# Patient Record
Sex: Female | Born: 1995 | Race: Black or African American | Hispanic: No | Marital: Single | Smoking: Former smoker
Health system: Southern US, Community
[De-identification: ages and names within clinical notes are randomized; demographics above are authoritative.]

## PROBLEM LIST (undated history)

## (undated) DIAGNOSIS — Z789 Other specified health status: Secondary | ICD-10-CM

## (undated) HISTORY — PX: DILATION AND CURETTAGE OF UTERUS: SHX78

## (undated) HISTORY — DX: Other specified health status: Z78.9

---

## 2017-10-22 ENCOUNTER — Encounter (HOSPITAL_COMMUNITY): Payer: Self-pay | Admitting: Emergency Medicine

## 2017-10-22 ENCOUNTER — Emergency Department (HOSPITAL_COMMUNITY)
Admission: EM | Admit: 2017-10-22 | Discharge: 2017-10-22 | Disposition: A | Discharge status: Home / Self Care | Payer: Self-pay | Attending: Emergency Medicine | Admitting: Emergency Medicine

## 2017-10-22 ENCOUNTER — Other Ambulatory Visit: Payer: Self-pay

## 2017-10-22 DIAGNOSIS — F17200 Nicotine dependence, unspecified, uncomplicated: Secondary | ICD-10-CM | POA: Insufficient documentation

## 2017-10-22 DIAGNOSIS — Z3201 Encounter for pregnancy test, result positive: Secondary | ICD-10-CM | POA: Insufficient documentation

## 2017-10-22 DIAGNOSIS — F17228 Nicotine dependence, chewing tobacco, with other nicotine-induced disorders: Secondary | ICD-10-CM | POA: Insufficient documentation

## 2017-10-22 DIAGNOSIS — A599 Trichomoniasis, unspecified: Secondary | ICD-10-CM | POA: Insufficient documentation

## 2017-10-22 LAB — WET PREP, GENITAL
CLUE CELLS WET PREP: NONE SEEN
Sperm: NONE SEEN
Yeast Wet Prep HPF POC: NONE SEEN

## 2017-10-22 LAB — I-STAT BETA HCG BLOOD, ED (MC, WL, AP ONLY)

## 2017-10-22 MED ORDER — FOLIC ACID 800 MCG PO TABS: 400.0000 ug | ORAL_TABLET | Freq: Every day | ORAL | 0 refills | Status: DC

## 2017-10-22 MED ORDER — METRONIDAZOLE 500 MG PO TABS
2000.0000 mg | ORAL_TABLET | Freq: Once | ORAL | Status: AC
  Administered 2017-10-22: 2000 mg via ORAL
  Filled 2017-10-22: qty 4

## 2017-10-22 NOTE — Discharge Instructions (Signed)
Your pregnancy test was positive today.   You have been treated for trichomonas in the emergency department today.  This is a sexually transmitted infection.  Please inform all of your sexual partners and let them know that they will need to be treated.  I have listed the information below to the health department where they can be treated.   Your chlamydia, gonorrhea, HIV and syphilis testing take a few days to return. Please follow up at the health department for treatment if any of these results are positive.   I have listed the information to [facility].  Please call and schedule an appointment for pregnancy care.  I have also written you a prescription for a prenatal vitamin.  It is important that you take this daily.  Return to the emergency department for any new or worsening symptoms.

## 2017-10-22 NOTE — ED Triage Notes (Signed)
States needs preg test and needs std c heck , LMP  Oc t 23 G 3 P 1 A 1 L1, spotted  Yesterday  Just a little,  Denies dysuria or vag d/c or any STD s/s

## 2017-10-22 NOTE — ED Provider Notes (Signed)
[facility] EMERGENCY [facility] Provider Note   CSN: [id number] Arrival date & time: 10/22/17  1557     History   Chief Complaint Chief Complaint  Patient presents with  . Exposure to STD    HPI Teresa Todd is a 21 y.o. female.  HPI  Teresa Todd is a 21yo female with no significant past medical history who presents to the emergency department for pregnancy test and STD check.  She states that her last menstrual period was October 23rd.  States that she had minimal spotting yesterday.  Denies fever, chills, fatigue, abdominal pain, dysuria, urinary frequency, vaginal discharge, pelvic pain, nausea/vomiting, diarrhea.  Denies menstrual bleeding today.  Is sexually active with one female partner.  Reports that she moved from [place] and does not have an OB/GYN set up here.  History reviewed. No pertinent past medical history.  There are no active problems to display for this patient.   History reviewed. No pertinent surgical history.  OB History    No data available       Home Medications    Prior to Admission medications   Not on File    Family History No family history on file.  Social History Social History   Tobacco Use  . Smoking status: Current Every Day Smoker  . Smokeless tobacco: Current User  Substance Use Topics  . Alcohol use: Not on file  . Drug use: Not on file     Allergies   Patient has no known allergies.   Review of Systems Review of Systems  Constitutional: Negative for chills, fatigue and fever.  HENT: Negative for congestion.   Eyes: Negative for visual disturbance.  Respiratory: Negative for shortness of breath.   Cardiovascular: Negative for chest pain.  Gastrointestinal: Negative for abdominal pain, diarrhea, nausea and vomiting.  Genitourinary: Negative for difficulty urinating, dysuria, flank pain, frequency, pelvic pain, vaginal bleeding and vaginal discharge.  Musculoskeletal: Negative for gait  problem.  Skin: Negative for rash.  Neurological: Negative for dizziness, light-headedness and headaches.  Psychiatric/Behavioral: Negative for agitation.     Physical Exam Updated Vital Signs There were no vitals taken for this visit.  Physical Exam  Constitutional: She is oriented to person, place, and time. She appears well-developed and well-nourished. No distress.  HENT:  Head: Normocephalic and atraumatic.  Mouth/Throat: Oropharynx is clear and moist. No oropharyngeal exudate.  Eyes: Conjunctivae are normal. Pupils are equal, round, and reactive to light. Right eye exhibits no discharge. Left eye exhibits no discharge.  Neck: Normal range of motion. Neck supple.  Cardiovascular: Normal rate, regular rhythm and intact distal pulses. Exam reveals no friction rub.  No murmur heard. Pulmonary/Chest: Effort normal. No respiratory distress.  Abdominal: Soft. Bowel sounds are normal. There is no tenderness. There is no guarding.  Musculoskeletal: Normal range of motion.  Neurological: She is alert and oriented to person, place, and time. Coordination normal.  Skin: Skin is warm and dry. Capillary refill takes less than 2 seconds. She is not diaphoretic.  Psychiatric: She has a normal mood and affect. Her behavior is normal.  Nursing note and vitals reviewed.   ED Treatments / Results  Labs (all labs ordered are listed, but only abnormal results are displayed) Labs Reviewed  I-STAT BETA HCG BLOOD, ED ([facility], [facility], [facility] ONLY) - Abnormal; Notable for the following components:      Result Value   I-stat hCG, quantitative >2,000.0 (*)    All other components within normal limits  RPR  HIV ANTIBODY (ROUTINE TESTING)    EKG  EKG Interpretation None       Radiology No results found.  Procedures Procedures (including critical care time)  Medications Ordered in ED Medications - No data to display   Initial Impression / Assessment and Plan / ED Course  I have reviewed the  triage vital signs and the nursing notes.  Pertinent labs & imaging results that were available during my care of the patient were reviewed by me and considered in my medical decision making (see chart for details).    Pregnancy test is positive and patient informed. Patient treated for trichomonas with Flagyl in the emergency department today.  Counseled her to inform all sexual partners and let them know that they will need to be treated as well.  Have counseled her to refrain from sexual intercourse until all sexual partners have been treated.  She has chlamydia/gonorrhea, HIV and syphilis testing which is pending, counseled her that she will need to be treated at the health department if these results return positive.  Patient agrees and voiced understanding to this plan.  Her vital signs are stable and she is in no acute distress.  She has no complaints prior to discharge. Have given patient information to establish care with women's health for pregnancy care and also written her a prescription for folic acid.   Final Clinical Impressions(s) / ED Diagnoses   Final diagnoses:  Positive pregnancy test  Trichomonosis     Teresa Todd, Teresa Maret Todd, Teresa Todd 10/22/17 1952    Teresa Todd, Teresa Duo, Teresa Todd 10/23/17 1346

## 2017-10-23 LAB — HIV ANTIBODY (ROUTINE TESTING W REFLEX): HIV SCREEN 4TH GENERATION: NONREACTIVE

## 2017-10-23 LAB — RPR: RPR Ser Ql: NONREACTIVE

## 2017-10-23 LAB — GC/CHLAMYDIA PROBE AMP (~~LOC~~) NOT AT ARMC
CHLAMYDIA, DNA PROBE: NEGATIVE
Neisseria Gonorrhea: NEGATIVE

## 2017-12-02 ENCOUNTER — Other Ambulatory Visit: Payer: Medicaid Other

## 2017-12-02 DIAGNOSIS — Z3480 Encounter for supervision of other normal pregnancy, unspecified trimester: Secondary | ICD-10-CM

## 2017-12-03 ENCOUNTER — Telehealth: Payer: Self-pay | Admitting: General Practice

## 2017-12-03 ENCOUNTER — Other Ambulatory Visit: Payer: Self-pay | Admitting: Advanced Practice Midwife

## 2017-12-03 DIAGNOSIS — Z3480 Encounter for supervision of other normal pregnancy, unspecified trimester: Secondary | ICD-10-CM | POA: Insufficient documentation

## 2017-12-03 LAB — OBSTETRIC PANEL, INCLUDING HIV
ANTIBODY SCREEN: NEGATIVE
BASOS ABS: 0 10*3/uL (ref 0.0–0.2)
Basos: 0 %
EOS (ABSOLUTE): 0.1 10*3/uL (ref 0.0–0.4)
Eos: 1 %
HIV Screen 4th Generation wRfx: NONREACTIVE
Hematocrit: 37.8 % (ref 34.0–46.6)
Hemoglobin: 12.6 g/dL (ref 11.1–15.9)
Hepatitis B Surface Ag: NEGATIVE
IMMATURE GRANS (ABS): 0 10*3/uL (ref 0.0–0.1)
IMMATURE GRANULOCYTES: 0 %
LYMPHS: 22 %
Lymphocytes Absolute: 1.9 10*3/uL (ref 0.7–3.1)
MCH: 30.1 pg (ref 26.6–33.0)
MCHC: 33.3 g/dL (ref 31.5–35.7)
MCV: 90 fL (ref 79–97)
MONOCYTES: 7 %
MONOS ABS: 0.6 10*3/uL (ref 0.1–0.9)
NEUTROS PCT: 70 %
Neutrophils Absolute: 5.8 10*3/uL (ref 1.4–7.0)
PLATELETS: 346 10*3/uL (ref 150–379)
RBC: 4.19 x10E6/uL (ref 3.77–5.28)
RDW: 13.8 % (ref 12.3–15.4)
RPR Ser Ql: NONREACTIVE
Rh Factor: POSITIVE
Rubella Antibodies, IGG: 3.04 index (ref >0.99)
WBC: 8.4 10*3/uL (ref 3.4–10.8)

## 2017-12-03 NOTE — Telephone Encounter (Signed)
Patient called and left message on nurse line stating she is calling to give [place] so we can have that in our system prior to her new OB appt on Monday. Called patient, no answer- left message on voicemail stating we are returning her phone call and have received her voicemail message. We do not need your ID number in advance, just bring your insurance card to your appt on Monday. You may call us back if you have other questions

## 2017-12-09 ENCOUNTER — Other Ambulatory Visit (HOSPITAL_COMMUNITY)
Admission: RE | Admit: 2017-12-09 | Discharge: 2017-12-09 | Disposition: A | Discharge status: Home / Self Care | Payer: Medicaid Other | Source: Ambulatory Visit | Attending: Advanced Practice Midwife | Admitting: Advanced Practice Midwife

## 2017-12-09 ENCOUNTER — Encounter: Payer: Self-pay | Admitting: Advanced Practice Midwife

## 2017-12-09 ENCOUNTER — Ambulatory Visit (INDEPENDENT_AMBULATORY_CARE_PROVIDER_SITE_OTHER): Payer: Medicaid Other | Admitting: Advanced Practice Midwife

## 2017-12-09 DIAGNOSIS — Z3482 Encounter for supervision of other normal pregnancy, second trimester: Secondary | ICD-10-CM | POA: Diagnosis present

## 2017-12-09 DIAGNOSIS — O09892 Supervision of other high risk pregnancies, second trimester: Secondary | ICD-10-CM | POA: Insufficient documentation

## 2017-12-09 DIAGNOSIS — Z23 Encounter for immunization: Secondary | ICD-10-CM

## 2017-12-09 DIAGNOSIS — Z3480 Encounter for supervision of other normal pregnancy, unspecified trimester: Secondary | ICD-10-CM

## 2017-12-09 DIAGNOSIS — O09899 Supervision of other high risk pregnancies, unspecified trimester: Secondary | ICD-10-CM

## 2017-12-09 LAB — POCT URINALYSIS DIP (DEVICE)
BILIRUBIN URINE: NEGATIVE
GLUCOSE, UA: NEGATIVE mg/dL
Hgb urine dipstick: NEGATIVE
LEUKOCYTES UA: NEGATIVE
NITRITE: NEGATIVE
Protein, ur: NEGATIVE mg/dL
Specific Gravity, Urine: >=1.03 (ref 1.005–1.030)
Urobilinogen, UA: 1 mg/dL (ref 0.0–1.0)
pH: 6 (ref 5.0–8.0)

## 2017-12-09 MED ORDER — PRENATAL VITAMINS 0.8 MG PO TABS: 1.0000 | ORAL_TABLET | Freq: Every day | ORAL | 12 refills | Status: DC

## 2017-12-09 NOTE — Progress Notes (Signed)
  Subjective:    Teresa Todd is being seen today for her first obstetrical visit.  This is not a planned pregnancy. She is at 5447w6d gestation. Her obstetrical history is significant for obesity. Relationship with FOB: "just friends". Patient does intend to breast feed. Pregnancy history fully reviewed.  [place].   Patient reports no complaints.  Review of Systems:   Review of Systems  All other systems reviewed and are negative.   Objective:     BP 122/73   Pulse 79   Ht 5\' 4"  (1.626 m)   Wt 210 lb (95.3 kg)   LMP 08/27/2017 (Exact Date)   BMI 36.05 kg/m  Physical Exam  Nursing note and vitals reviewed. Constitutional: She is oriented to person, place, and time. She appears well-developed and well-nourished. No distress.  HENT:  Head: Normocephalic.  Cardiovascular: Normal rate.  Respiratory: Effort normal. Right breast exhibits no inverted nipple, no mass, no nipple discharge, no skin change and no tenderness. Left breast exhibits no inverted nipple, no mass, no nipple discharge, no skin change and no tenderness. Breasts are symmetrical.  GI: Soft. There is no tenderness. There is no rebound.  Genitourinary: No vaginal discharge found.  Neurological: She is alert and oriented to person, place, and time.  Skin: Skin is warm and dry.  Psychiatric: She has a normal mood and affect.    Maternal Exam:  Introitus: Vagina is negative for discharge.    Fetal Exam Fetal Monitor Review: Mode: ultrasound.   Baseline rate: 140.         Assessment:    Pregnancy: [id number] Patient Active Problem List   Diagnosis Date Noted  . Short interval between pregnancies affecting pregnancy, antepartum 12/09/2017  . Supervision of other normal pregnancy, antepartum 12/03/2017       Plan:     Initial labs drawn. Had pap done in [place], will sign release for results.  Prenatal vitamins. SMA, CF, Hgb  elec, panorama  Problem list reviewed and updated. AFP3 discussed: undecided. Role of ultrasound in pregnancy discussed; fetal survey: requested. Amniocentesis discussed: not indicated. Follow up in 6 weeks. 50% of 60 min visit spent on counseling and coordination of care.     Thressa ShellerHeather Hogan 12/09/2017

## 2017-12-09 NOTE — Patient Instructions (Signed)
AREA PEDIATRIC/FAMILY PRACTICE PHYSICIANS  [facility] [address], [address] [place], [place]  [address] Phone - [phone]   Fax - [phone]  [facility] [address] [place], [place] [address] Phone - [phone]   Fax - [phone]  JACK AMOS [address] [place], [place]  [address] Phone - [phone]   Fax - [phone]  [facility] [address] [place], [place]  [address] Phone - [phone]   Fax - [phone]  [facility] [address] [place], [place]  [address] Phone - [phone]   Fax - [phone]  [facility] [address] [place], [place]  [address] Phone - [phone]   Fax - [phone]  [facility] [address] [place], [place]  [address] Phone - [phone]   Fax - [phone]  [facility] [address] [place], [place]  [address] Phone - [phone]   Fax - [phone]  [facility] [address] [place], [place]  [address] Phone - [phone]   Fax - [phone] [facility] [address] [place], [place]  [address] Phone - [phone]   Fax - [phone]  [facility] [address] [place], [place]  [address] Phone - [phone]   Fax - [phone]  [facility] [address] [place], [place]  [address] Phone - [phone]   Fax - [phone]  [facility] [address] [place], [place]  [address] Phone - [phone]   Fax - [phone]  SHILPA GOSRANI [address] [place], [place]  [address] Phone - [phone]  [facility] [address] [place], [place]  [address] Phone - [phone]   Fax - [phone]  [facility] [address] [place], [place]  [address] Phone - [phone]   Fax - [phone]  [facility] [address] [place], [place]  [address] Phone - [phone]   Fax - [phone]  [facility] [address] [place], [place]  [address] Phone - [phone]   Fax - [phone]   [facility] [address] [place], [place]  [address] Phone - [phone]   Fax - [phone]  [facility] [address] [place], [place]  [address] Phone - [phone]   Fax - [phone]  DAVID RUBIN [address] [place], [place]  [address] Phone - [phone]   Fax - [phone]  [facility] [address] [place], [place]  [address] Phone - [phone]   Fax - [phone]  [facility] [address] [place], [place]  [address] Phone - [phone]   Fax - [phone] [facility] [address] [place], [place]  [address] Phone - [phone]   Fax - [phone]  [facility] [address] [place], [place]  [address] Phone - [phone]   Fax - [phone]   [facility] [address] [place],   [address] Phone - [phone]   Fax - [phone]  [facility] Charlene Flemming MD [address] [place]  [address] Phone [phone]  Fax [phone]  Childbirth Education Options: [facility] Classes:  Childbirth education classes can help you   get ready for a positive parenting experience. You can also meet other expectant parents and get free stuff for your baby. Each class runs for five weeks on the same night and costs $45 for the mother-to-be and her support person. Medicaid covers the cost if you are eligible. Call [phone] to register. [facility] Childbirth Education:  [phone] or 5174845873 or  [email or web address]  Baby & Me Class: Discuss newborn & infant parenting and family adjustment issues with other new mothers in a relaxed environment. Each week brings a new speaker or baby-centered activity. We encourage new mothers to join [place] every Thursday at 11:00am. Babies birth until crawling. No registration or fee. Daddy [employer]: This course offers Dads-to-be the tools and knowledge needed to feel confident on their journey to becoming new fathers. Experienced dads, who have been trained as coaches, teach dads-to-be how to hold, comfort, diaper, swaddle and play with their infant while being able to support the new mom as well. A class for men taught by men. $25/dad Big Brother/Big Sister: Let your children share in the joy of a new brother or sister in this special class designed just for them. Class includes discussion about how families care for babies: swaddling, holding, diapering, safety as well as how they can be helpful in their new role. This class is designed for children ages 26 to 31, but any age is welcome. Please register each child individually. $5/child  Mom Talk: This mom-led group offers support and connection to mothers as they journey through the adjustments and struggles of that sometimes overwhelming first year after the birth of a child. Tuesdays at 10:00am and Thursdays at 6:00pm. Babies welcome. No registration or fee. Breastfeeding Support Group: This group is a mother-to-mother support circle where moms have the opportunity to share their breastfeeding experiences. A Lactation Consultant is present for questions and concerns. Meets each Tuesday at 11:00am. No fee or registration. Breastfeeding Your Baby: Learn what to expect in the first days of breastfeeding your newborn.  This class will help you feel more confident with the skills needed to begin your breastfeeding experience. Many new mothers are concerned about breastfeeding after leaving the hospital. This class  will also address the most common fears and challenges about breastfeeding during the first few weeks, months and beyond. (call for fee) Comfort Techniques and Tour: This 2 hour interactive class will provide you the opportunity to learn & practice hands-on techniques that can help relieve some of the discomfort of labor and encourage your baby to rotate toward the best position for birth. You and your partner will be able to try a variety of labor positions with birth balls and rebozos as well as practice breathing, relaxation, and visualization techniques. A tour of the [facility] is included with this class. $20 per registrant and support person Childbirth Class- Weekend Option: This class is a Weekend version of our Birth & Baby series. It is designed for parents who have a difficult time fitting several weeks of classes into their schedule. It covers the care of your newborn and the basics of labor and childbirth. It also includes a [place] [facility] and lunch. The class is held two consecutive days: beginning on Friday evening from 6:30 - 8:30 p.m. and the next day, Saturday from 9 a.m. - 4 p.m. (call for fee) Doren Custard Class: Interested in a waterbirth?  This informational class will help you discover whether waterbirth is the right fit for you.  Education about waterbirth itself, supplies you would need and how to assemble your support team is what you can expect from this class. Some obstetrical practices require this class in order to pursue a waterbirth. (Not all obstetrical practices offer waterbirth-check with your healthcare provider.) Register only the expectant mom, but you are encouraged to bring your partner to class! Required if planning waterbirth, no fee. Infant/Child CPR: Parents, grandparents, babysitters, and friends learn Cardio-Pulmonary Resuscitation skills for infants and children. You will also learn how to treat both conscious  and unconscious choking in infants and children. This Family & Friends program does not offer certification. Register each participant individually to ensure that enough mannequins are available. (Call for fee) Grandparent Love: Expecting a grandbaby? This class is for you! Learn about the latest infant care and safety recommendations and ways to support your own child as he or she transitions into the parenting role. Taught by Registered Nurses who are childbirth instructors, but most importantly...they are grandmothers too! $10/person. Childbirth Class- Natural Childbirth: This series of 5 weekly classes is for expectant parents who want to learn and practice natural methods of coping with the process of labor and childbirth. Relaxation, breathing, massage, visualization, role of the partner, and helpful positioning are highlighted. Participants learn how to be confident in their body's ability to give birth. This class will empower and help parents make informed decisions about their own care. Includes discussion that will help new parents transition into the immediate postpartum period. [facility] is included. We suggest taking this class between 25-32 weeks, but it's only a recommendation. $75 per registrant and one support person or $30 Medicaid. Childbirth Class- 3 week Series: This option of 3 weekly classes helps you and your labor partner prepare for childbirth. Newborn care, labor & birth, cesarean birth, pain management, and comfort techniques are discussed and a [place] [facility] is included. The class meets at the same time, on the same day of the week for 3 consecutive weeks beginning with the starting date you choose. $60 for registrant and one support person.  Marvelous Multiples: Expecting twins, triplets, or more? This class covers the differences in labor, birth, parenting, and breastfeeding issues that face multiples' parents.  NICU tour is included. Led by a Certified Childbirth Educator who is the mother of twins. No fee. Caring for Baby: This class is for expectant and adoptive parents who want to learn and practice the most up-to-date newborn care for their babies. Focus is on birth through the first six weeks of life. Topics include feeding, bathing, diapering, crying, umbilical cord care, circumcision care and safe sleep. Parents learn to recognize symptoms of illness and when to call the pediatrician. Register only the mom-to-be and your partner or support person can plan to come with you! $10 per registrant and support person Childbirth Class- online option: This online class offers you the freedom to complete a Birth and Baby series in the comfort of your own home. The flexibility of this option allows you to review sections at your own pace, at times convenient to you and your support people. It includes additional video information, animations, quizzes, and extended activities. Get organized with helpful eClass tools, checklists, and trackers. Once you register online for the class, you will receive an email within a few days to accept the invitation and begin the class when the time is right for you. The content will be available to you for 60 days. $  60 for 60 days of online access for you and your support people.  Local Doulas: Natural Baby Doulas [email or web address] Tel: [phone] [email or web address] [facility] [phone] [email or web address] The Labor Ladies  (also do waterbirth tub rental) [phone] [email or web address] [email or web address] [phone] [email or web address] Sacred Rhythms  [phone] [email or web address]/ [place] [facility] [employer] (PADA) [email or web address] http://www.padanc.org/index.htm [facility]   [email or web address] Considering Waterbirth? Guide for [facility] at [facility]  Why consider waterbirth?  . Gentle birth for babies . Less pain medicine used in labor . May allow for passive descent/less pushing . May reduce perineal tears  . More mobility and instinctive maternal position changes . Increased maternal relaxation . Reduced blood pressure in labor  Is waterbirth safe? What are the risks of infection, drowning or other complications?  . Infection: o Very low risk (3.7 % for tub vs 4.8% for bed) o 7 in 8000 waterbirths with documented infection o Poorly cleaned equipment most common cause o Slightly lower group B strep transmission rate  . Drowning o Maternal:  - Very low risk   - Related to seizures or fainting o Newborn:  - Very low risk. No evidence of increased risk of respiratory problems in multiple large studies - Physiological protection from breathing under water - Avoid underwater birth if there are any fetal complications - Once baby's head is out of the water, keep it out.  . Birth complication o Some reports of cord trauma, but risk decreased by bringing baby to surface gradually o No evidence of increased risk of shoulder dystocia. Mothers can usually change positions faster in water than in a bed, possibly aiding the maneuvers to free the shoulder.   You must attend a [facility] class [facility]  3rd Wednesday of every month from 7-9pm  Free  Register by calling [phone] or online at [email or web address]  Bring us the certificate from the class to your prenatal appointment  Meet with a midwife at 36 weeks to see if you can still plan a waterbirth and to sign the consent.   Purchase or rent the following supplies:   Water Birth Pool (Birth Pool in a Box or [place] for instance)  (Tubs start ~$125)  Single-use disposable tub liner designed for your brand of tub  New garden hose labeled  "lead-free", "suitable for drinking water",  Electric drain pump to remove water (We recommend 792 gallon per hour or greater pump.)   Separate garden hose to remove the dirty water  Fish net  Bathing suit top (optional)  Long-handled mirror (optional)  Places to purchase or rent supplies  [email or web address] for tub purchases and supplies  Waterbirthsolutions.com for tub purchases and supplies  The Labor Ladies (www.[email or web address]) $275 for tub rental/set-up & take down/kit   [place] [facility] (http://www.padanc.org/MeetUs.htm) Information regarding doulas (labor support) who provide pool rentals  Our practice has a Birth Pool in a Box tub at the hospital that you may borrow on a first-come-first-served basis. It is your responsibility to to set up, clean and break down the tub. We cannot guarantee the availability of this tub in advance. You are responsible for bringing all accessories listed above. If you do not have all necessary supplies you cannot have a waterbirth.    Things that would prevent you from having a waterbirth:  Premature, <37wks  Previous cesarean birth  Presence of thick meconium-stained fluid  Multiple gestation (Twins, triplets, etc.)  Uncontrolled diabetes or gestational diabetes requiring medication  Hypertension requiring medication   or diagnosis of pre-eclampsia  Heavy vaginal bleeding  Non-reassuring fetal heart rate  Active infection (MRSA, etc.). Group B Strep is NOT a contraindication for  waterbirth.  If your labor has to be induced and induction method requires continuous  monitoring of the baby's heart rate  Other risks/issues identified by your obstetrical provider  Please remember that birth is unpredictable. Under certain unforeseeable circumstances your provider may advise against giving birth in the tub. These decisions will be made on a case-by-case basis and with the safety of you and your baby as our highest  priority.     Childbirth Education Options: [facility] Classes:  Childbirth education classes can help you get ready for a positive parenting experience. You can also meet other expectant parents and get free stuff for your baby. Each class runs for five weeks on the same night and costs $45 for the mother-to-be and her support person. Medicaid covers the cost if you are eligible. Call [phone] to register. [facility] Childbirth Education:  [phone] or [phone] or [email or web address]  Baby & Me Class: Discuss newborn & infant parenting and family adjustment issues with other new mothers in a relaxed environment. Each week brings a new speaker or baby-centered activity. We encourage new mothers to join [place] every Thursday at 11:00am. Babies birth until crawling. No registration or fee. Daddy [employer]: This course offers Dads-to-be the tools and knowledge needed to feel confident on their journey to becoming new fathers. Experienced dads, who have been trained as coaches, teach dads-to-be how to hold, comfort, diaper, swaddle and play with their infant while being able to support the new mom as well. A class for men taught by men. $25/dad Big Brother/Big Sister: Let your children share in the joy of a new brother or sister in this special class designed just for them. Class includes discussion about how families care for babies: swaddling, holding, diapering, safety as well as how they can be helpful in their new role. This class is designed for children ages 30 to 10, but any age is welcome. Please register each child individually. $5/child  Mom Talk: This mom-led group offers support and connection to mothers as they journey through the adjustments and struggles of that sometimes overwhelming first year after the birth of a child. Tuesdays at 10:00am and Thursdays at 6:00pm. Babies welcome. No registration or fee. Breastfeeding Support Group: This group is a  mother-to-mother support circle where moms have the opportunity to share their breastfeeding experiences. A Lactation Consultant is present for questions and concerns. Meets each Tuesday at 11:00am. No fee or registration. Breastfeeding Your Baby: Learn what to expect in the first days of breastfeeding your newborn.  This class will help you feel more confident with the skills needed to begin your breastfeeding experience. Many new mothers are concerned about breastfeeding after leaving the hospital. This class will also address the most common fears and challenges about breastfeeding during the first few weeks, months and beyond. (call for fee) Comfort Techniques and Tour: This 2 hour interactive class will provide you the opportunity to learn & practice hands-on techniques that can help relieve some of the discomfort of labor and encourage your baby to rotate toward the best position for birth. You and your partner will be able to try a variety of labor positions with birth balls and rebozos as well as practice breathing, relaxation, and visualization techniques. A tour of the [facility] is included with this class. $20  per registrant and support person Childbirth Class- Weekend Option: This class is a Weekend version of our Birth & Baby series. It is designed for parents who have a difficult time fitting several weeks of classes into their schedule. It covers the care of your newborn and the basics of labor and childbirth. It also includes a [place] [facility] and lunch. The class is held two consecutive days: beginning on Friday evening from 6:30 - 8:30 p.m. and the next day, Saturday from 9 a.m. - 4 p.m. (call for fee) Doren Custard Class: Interested in a waterbirth?  This informational class will help you discover whether waterbirth is the right fit for you. Education about waterbirth itself, supplies you would need and how to assemble your support team  is what you can expect from this class. Some obstetrical practices require this class in order to pursue a waterbirth. (Not all obstetrical practices offer waterbirth-check with your healthcare provider.) Register only the expectant mom, but you are encouraged to bring your partner to class! Required if planning waterbirth, no fee. Infant/Child CPR: Parents, grandparents, babysitters, and friends learn Cardio-Pulmonary Resuscitation skills for infants and children. You will also learn how to treat both conscious and unconscious choking in infants and children. This Family & Friends program does not offer certification. Register each participant individually to ensure that enough mannequins are available. (Call for fee) Grandparent Love: Expecting a grandbaby? This class is for you! Learn about the latest infant care and safety recommendations and ways to support your own child as he or she transitions into the parenting role. Taught by Registered Nurses who are childbirth instructors, but most importantly...they are grandmothers too! $10/person. Childbirth Class- Natural Childbirth: This series of 5 weekly classes is for expectant parents who want to learn and practice natural methods of coping with the process of labor and childbirth. Relaxation, breathing, massage, visualization, role of the partner, and helpful positioning are highlighted. Participants learn how to be confident in their body's ability to give birth. This class will empower and help parents make informed decisions about their own care. Includes discussion that will help new parents transition into the immediate postpartum period. [facility] is included. We suggest taking this class between 25-32 weeks, but it's only a recommendation. $75 per registrant and one support person or $30 Medicaid. Childbirth Class- 3 week Series: This option of 3 weekly classes helps you and your labor partner prepare for  childbirth. Newborn care, labor & birth, cesarean birth, pain management, and comfort techniques are discussed and a [place] [facility] is included. The class meets at the same time, on the same day of the week for 3 consecutive weeks beginning with the starting date you choose. $60 for registrant and one support person.  Marvelous Multiples: Expecting twins, triplets, or more? This class covers the differences in labor, birth, parenting, and breastfeeding issues that face multiples' parents. NICU tour is included. Led by a Certified Childbirth Educator who is the mother of twins. No fee. Caring for Baby: This class is for expectant and adoptive parents who want to learn and practice the most up-to-date newborn care for their babies. Focus is on birth through the first six weeks of life. Topics include feeding, bathing, diapering, crying, umbilical cord care, circumcision care and safe sleep. Parents learn to recognize symptoms of illness and when to call the pediatrician. Register only the mom-to-be and your partner or support person can plan  to come with you! $10 per registrant and support person Childbirth Class- online option: This online class offers you the freedom to complete a Birth and Baby series in the comfort of your own home. The flexibility of this option allows you to review sections at your own pace, at times convenient to you and your support people. It includes additional video information, animations, quizzes, and extended activities. Get organized with helpful eClass tools, checklists, and trackers. Once you register online for the class, you will receive an email within a few days to accept the invitation and begin the class when the time is right for you. The content will be available to you for 60 days. $60 for 60 days of online access for you and your support people.  Local Doulas: Natural Baby Doulas [email or web address] Tel:  [phone] [email or web address]/ [employer] [phone] [email or web address] .com www.[email or web address].com The Labor [facility] Halim  (also do waterbirth tub rental) [phone] [email or web address] https://www.[email or web address]/ [facility]oula [phone] [email or web address] [email or web address].fi Sacred Rhythms  [phone] https://sacred-rhythms.com/ [employer] (PADA) [email or web address] https://www.frey.org/ [facility] Baby  http://[email or web address]/ Considering Waterbirth? Guide for patients at [facility] [employer] [facility]  Why consider waterbirth?  . Gentle birth for babies . Less pain medicine used in labor . May allow for passive descent/less pushing . May reduce perineal tears  . More mobility and instinctive maternal position changes . Increased maternal relaxation . Reduced blood pressure in labor  Is waterbirth safe? What are the risks of infection, drowning or other complications?  . Infection: o Very low risk (3.7 % for tub vs 4.8% for bed) o 7 in 8000 waterbirths with documented infection o Poorly cleaned equipment most common cause o Slightly lower group B strep transmission rate  . Drowning o Maternal:  - Very low risk   - Related to seizures or fainting o Newborn:  - Very low risk. No evidence of increased risk of respiratory problems in multiple large studies - Physiological protection from breathing under water - Avoid underwater birth if there are any fetal complications - Once baby's head is out of the water, keep it out.  . Birth complication o Some reports of cord trauma, but risk decreased by bringing baby to surface gradually o No evidence of increased risk of shoulder dystocia. Mothers can usually change positions faster in water than in a bed, possibly aiding the maneuvers to free the shoulder.   You must attend a Doren Custard class at [facility]  3rd Wednesday of every month from 7-9pm  [employer] by calling [phone] or online at [email or web address]  Bring [place] the certificate from the class to your prenatal appointment  Meet with a midwife at 36 weeks to see if you can still plan a waterbirth and to sign the consent.   Purchase or rent the following supplies:   Water Birth Pool (Birth Pool in a Box or [place] for instance)  (Tubs start ~$125)  Single-use disposable tub liner designed for your brand of tub  New garden hose labeled "lead-free", "suitable for drinking water",  Electric drain pump to remove water (We recommend 792 gallon per hour or greater pump.)   Separate garden hose to remove the dirty water  Fish net  Bathing suit top (optional)  Long-handled mirror (optional)  Places to purchase or rent supplies  GotWebTools.is for tub purchases and supplies  Waterbirthsolutions.com for tub purchases and supplies  The Labor Ladies (www.thelaborladies.com) $275 for tub rental/set-up & take down/kit   [employer] (http://www.fleming.com/.htm) Information regarding  doulas (labor support) who provide pool rentals  Our practice has a Heritage manager in a Box tub at the hospital that you may borrow on a first-come-first-served basis. It is your responsibility to to set up, clean and break down the tub. We cannot guarantee the availability of this tub in advance. You are responsible for bringing all accessories listed above. If you do not have all necessary supplies you cannot have a waterbirth.    Things that would prevent you from having a waterbirth:  Premature, <37wks  Previous cesarean birth  Presence of thick meconium-stained fluid  Multiple gestation (Twins, triplets, etc.)  Uncontrolled diabetes or gestational diabetes requiring medication  Hypertension requiring medication or diagnosis of pre-eclampsia  Heavy vaginal bleeding  Non-reassuring fetal  heart rate  Active infection (MRSA, etc.). Group B Strep is NOT a contraindication for  waterbirth.  If your labor has to be induced and induction method requires continuous  monitoring of the baby's heart rate  Other risks/issues identified by your obstetrical provider  Please remember that birth is unpredictable. Under certain unforeseeable circumstances your provider may advise against giving birth in the tub. These decisions will be made on a case-by-case basis and with the safety of you and your baby as our highest priority.     Safe Medications in Pregnancy   Acne: Benzoyl Peroxide Salicylic Acid  Backache/Headache: Tylenol: 2 regular strength every 4 hours OR              2 Extra strength every 6 hours  Colds/Coughs/Allergies: Benadryl (alcohol free) 25 mg every 6 hours as needed Breath right strips Claritin Cepacol throat lozenges Chloraseptic throat spray Cold-Eeze- up to three times per day Cough drops, alcohol free Flonase (by prescription only) Guaifenesin Mucinex Robitussin DM (plain only, alcohol free) Saline nasal spray/drops Sudafed (pseudoephedrine) & Actifed ** use only after [redacted] weeks gestation and if you do not have high blood pressure Tylenol Vicks Vaporub Zinc lozenges Zyrtec   Constipation: Colace Ducolax suppositories Fleet enema Glycerin suppositories Metamucil Milk of magnesia Miralax Senokot Smooth move tea  Diarrhea: Kaopectate Imodium A-D  *NO pepto Bismol  Hemorrhoids: Anusol Anusol HC Preparation H Tucks  Indigestion: Tums Maalox Mylanta Zantac  Pepcid  Insomnia: Benadryl (alcohol free) 105m every 6 hours as needed Tylenol PM Unisom, no Gelcaps  Leg Cramps: Tums MagGel  Nausea/Vomiting:  Bonine Dramamine Emetrol Ginger extract Sea bands Meclizine  Nausea medication to take during pregnancy:  Unisom (doxylamine succinate 25 mg tablets) Take one tablet daily at bedtime. If symptoms are not adequately  controlled, the dose can be increased to a maximum recommended dose of two tablets daily (1/2 tablet in the morning, 1/2 tablet mid-afternoon and one at bedtime). Vitamin B6 1057mtablets. Take one tablet twice a day (up to 200 mg per day).  Skin Rashes: Aveeno products Benadryl cream or 2536mvery 6 hours as needed Calamine Lotion 1% cortisone cream  Yeast infection: Gyne-lotrimin 7 Monistat 7   **If taking multiple medications, please check labels to avoid duplicating the same active ingredients **take medication as directed on the label ** Do not exceed 4000 mg of tylenol in 24 hours **Do not take medications that contain aspirin or ibuprofen

## 2017-12-10 ENCOUNTER — Encounter: Payer: Self-pay | Admitting: *Deleted

## 2017-12-11 LAB — CULTURE, OB URINE

## 2017-12-11 LAB — URINE CULTURE, OB REFLEX

## 2017-12-11 LAB — CERVICOVAGINAL ANCILLARY ONLY: Trichomonas: NEGATIVE

## 2017-12-12 DIAGNOSIS — O09899 Supervision of other high risk pregnancies, unspecified trimester: Secondary | ICD-10-CM

## 2017-12-16 LAB — SMN1 COPY NUMBER ANALYSIS (SMA CARRIER SCREENING)

## 2017-12-17 ENCOUNTER — Encounter: Payer: Self-pay | Admitting: *Deleted

## 2017-12-17 LAB — HEMOGLOBINOPATHY EVALUATION
FERRITIN: 15 ng/mL (ref 15–150)
HEMATOCRIT: 36.2 % (ref 34.0–46.6)
HGB C: 0 %
Hemoglobin: 12.1 g/dL (ref 11.1–15.9)
Hgb A2 Quant: 2.5 % (ref 1.8–3.2)
Hgb A: 97.5 % (ref 96.4–98.8)
Hgb F Quant: 0 % (ref 0.0–2.0)
Hgb S: 0 %
Hgb Solubility: NEGATIVE
Hgb Variant: 0 %
MCH: 30 pg (ref 26.6–33.0)
MCHC: 33.4 g/dL (ref 31.5–35.7)
MCV: 90 fL (ref 79–97)
Platelets: 362 10*3/uL (ref 150–379)
RBC: 4.03 x10E6/uL (ref 3.77–5.28)
RDW: 13.6 % (ref 12.3–15.4)
WBC: 9.1 10*3/uL (ref 3.4–10.8)

## 2017-12-17 LAB — CYSTIC FIBROSIS GENE TEST

## 2017-12-18 ENCOUNTER — Other Ambulatory Visit: Payer: Self-pay | Admitting: Advanced Practice Midwife

## 2017-12-18 DIAGNOSIS — Z3480 Encounter for supervision of other normal pregnancy, unspecified trimester: Secondary | ICD-10-CM

## 2017-12-26 ENCOUNTER — Encounter: Payer: Self-pay | Admitting: General Practice

## 2018-01-06 ENCOUNTER — Other Ambulatory Visit: Payer: Self-pay | Admitting: Advanced Practice Midwife

## 2018-01-06 ENCOUNTER — Ambulatory Visit (HOSPITAL_COMMUNITY)
Admission: RE | Admit: 2018-01-06 | Discharge: 2018-01-06 | Disposition: A | Discharge status: Home / Self Care | Payer: Medicaid Other | Source: Ambulatory Visit | Attending: Advanced Practice Midwife | Admitting: Advanced Practice Midwife

## 2018-01-06 ENCOUNTER — Ambulatory Visit (INDEPENDENT_AMBULATORY_CARE_PROVIDER_SITE_OTHER): Payer: Medicaid Other | Admitting: Advanced Practice Midwife

## 2018-01-06 DIAGNOSIS — Z3480 Encounter for supervision of other normal pregnancy, unspecified trimester: Secondary | ICD-10-CM

## 2018-01-06 DIAGNOSIS — O09892 Supervision of other high risk pregnancies, second trimester: Secondary | ICD-10-CM

## 2018-01-06 DIAGNOSIS — Z3A18 18 weeks gestation of pregnancy: Secondary | ICD-10-CM

## 2018-01-06 DIAGNOSIS — O322XX0 Maternal care for transverse and oblique lie, not applicable or unspecified: Secondary | ICD-10-CM | POA: Insufficient documentation

## 2018-01-06 DIAGNOSIS — Z3A17 17 weeks gestation of pregnancy: Secondary | ICD-10-CM | POA: Insufficient documentation

## 2018-01-06 NOTE — Progress Notes (Signed)
   PRENATAL VISIT NOTE  Subjective:  Teresa Todd is a 22 y.o. [id number] at 5567w6d being seen today for ongoing prenatal care.  She is currently monitored for the following issues for this low-risk pregnancy and has Supervision of other normal pregnancy, antepartum and Short interval between pregnancies affecting pregnancy, antepartum on their problem list.  Patient reports no complaints.  Contractions: Not present. Vag. Bleeding: None.  Movement: Present. Denies leaking of fluid.   The following portions of the patient's history were reviewed and updated as appropriate: allergies, current medications, past family history, past medical history, past social history, past surgical history and problem list. Problem list updated.  Objective:   Vitals:   01/06/18 1416  BP: 124/75  Pulse: 80  Weight: 218 lb 11.2 oz (99.2 kg)    Fetal Status: Fetal Heart Rate (bpm): 154   Movement: Present     General:  Alert, oriented and cooperative. Patient is in no acute distress.  Skin: Skin is warm and dry. No rash noted.   Cardiovascular: Normal heart rate noted  Respiratory: Normal respiratory effort, no problems with respiration noted  Abdomen: Soft, gravid, appropriate for gestational age.  Pain/Pressure: Present     Pelvic: Cervical exam deferred        Extremities: Normal range of motion.  Edema: None  Mental Status:  Normal mood and affect. Normal behavior. Normal judgment and thought content.   Assessment and Plan:  Pregnancy: G4P1021 at 6767w6d  1. Supervision of other normal pregnancy, antepartum - Baby scripts schedule optimization - 2 hour GTT at next visit (28 weeks visit).  - Panorama sample was inadequate for analysis. Patient declines to have this repeated.   Preterm labor symptoms and general obstetric precautions including but not limited to vaginal bleeding, contractions, leaking of fluid and fetal movement were reviewed in detail with the patient. Please refer to After  Visit Summary for other counseling recommendations.  Return in about 10 weeks (around 03/17/2018).   Thressa ShellerHeather Hogan, CNM

## 2018-01-06 NOTE — Patient Instructions (Signed)
Places to have your son circumcised:    [facility] [phone] $[phone] while you are in hospital  [facility] [phone] $244 by 4 wks  [facility] [phone] $175 by 2 wks  [facility] [phone] $250 by 7 days [facility] [phone] $269 by 4 wks  These prices sometimes change but are roughly what you can expect to pay. Please call and confirm pricing.   Circumcision is considered an elective/non-medically necessary procedure. There are many reasons parents decide to have their sons circumsized. During the first year of life circumcised males have a reduced risk of urinary tract infections but after this year the rates between circumcised males and uncircumcised males are the same.  It is safe to have your son circumcised outside of the hospital and the places above perform them regularly.   Deciding about Circumcision in Baby Boys  (Up-to-date The Basics)  What is circumcision?  Circumcision is a surgery that removes the skin that covers the tip of the penis, called the "foreskin" Circumcision is usually done when a boy is between 1 and 10 days old. In the [place], circumcision is common. In some other countries, fewer boys are circumcised. Circumcision is a common tradition in some religions.  Should I have my baby boy circumcised?  There is no easy answer. Circumcision has some benefits. But it also has risks. After talking with your doctor, you will have to decide for yourself what is right for your family.  What are the benefits of circumcision?  Circumcised boys seem to have slightly lower rates of: ?Urinary tract infections ?Swelling of the opening at the tip of the penis Circumcised men seem to have slightly lower rates of: ?Urinary tract infections ?Swelling of the opening at the tip of the penis ?Penis  cancer ?HIV and other infections that you catch during sex ?Cervical cancer in the women they have sex with Even so, in the [place], the risks of these problems are small - even in boys and men who have not been circumcised. Plus, boys and men who are not circumcised can reduce these extra risks by: ?Cleaning their penis well ?Using condoms during sex  What are the risks of circumcision?  Risks include: ?Bleeding or infection from the surgery ?Damage to or amputation of the penis ?A chance that the doctor will cut off too much or not enough of the foreskin ?A chance that sex won't feel as good later in life Only about 1 out of every 200 circumcisions leads to problems. There is also a chance that your health insurance won't pay for circumcision.  How is circumcision done in baby boys?  First, the baby gets medicine for pain relief. This might be a cream on the skin or a shot into the base of the penis. Next, the doctor cleans the baby's penis well. Then he or she uses special tools to cut off the foreskin. Finally, the doctor wraps a bandage (called gauze) around the baby's penis. If you have your baby circumcised, his doctor or nurse will give you instructions on how to care for him after the surgery. It is important that you follow those instructions carefully.  AREA PEDIATRIC/FAMILY PRACTICE PHYSICIANS  [facility] [address], [address] [place], [place]  [address] Phone - [phone]   Fax - [phone]  [facility] [address] [place], [place] [address] Phone - [phone]   Fax - [phone]  JACK AMOS [address] [place], [place]    [address] Phone - [phone]   Fax - [phone]  [facility] [address] [place], [place]  [address] Phone - [phone]   Fax - [phone]  [facility] [address] [place], [place]  [address] Phone - [phone]   Fax -  [phone]  [facility] [address], [address] [place], [place]  [address] Phone - [phone]   Fax - [phone]  [facility] [address], [address] [place], [place]  [address] Phone - [phone]   Fax - [phone]  [facility] [address] [place], [place]  [address] Phone - [phone]   Fax - [phone]  [facility] [address] [place], [place]  [address] Phone - [phone]   Fax - [phone] [facility] [address] [place], [place]  [address] Phone - [phone]   Fax - [phone]  [facility] [address] [place], [place]  [address] Phone - [phone]   Fax - [phone]  [facility] [address], [address] [place], [place]  [address] Phone - [phone]   Fax - [phone]  [facility] [address] [place], [place]  [address] Phone - [phone]   Fax - [phone]  [facility] [address], [address] [place], [place]  [address] Phone - [phone]  [facility] [address] [place], [place]  [address] Phone - [phone]   Fax - [phone]  [facility] [address], [address] [place], [place]  [address] Phone - [phone]   Fax - [phone]  [facility] [address] [place], [place]  [address] Phone - [phone]   Fax - [phone]  [facility] [address] [place], [place]  [address] Phone - [phone]   Fax - [phone]   [facility] [address], [address] [place], [place]  [address] Phone - [phone]   Fax - [phone]  [facility] [address], [address] [place], [place]  [address] Phone - [phone]   Fax - [phone]  [facility] RUBIN [address], [address] [place], [place]  [address] Phone - [phone]   Fax - [phone]  [facility] [address] [place], [place]  [address] Phone - [phone]   Fax - [phone]  [facility] [address] [place], [place]  [address] Phone - [phone]   Fax - [phone] [facility] [address] [place], [place]  [address] Phone - [phone]   Fax - [phone]  [facility] [address] [place], [place]  [address] Phone - [phone]   Fax - [phone]  [facility] [place] [address] [place], [place]  [address] Phone - [phone]   Fax - [phone]  [facility] Wyvonne Lenzharlene Flemming MD [address] [place] [address] Phone [phone]  Fax [phone]  Safe Medications in Pregnancy   Acne: Benzoyl Peroxide Salicylic Acid  Backache/Headache: Tylenol: 2 regular strength every 4 hours OR              2 Extra strength every 6 hours  Colds/Coughs/Allergies: Benadryl (alcohol free) 25 mg every 6 hours as needed Breath right strips Claritin Cepacol throat lozenges Chloraseptic throat spray Cold-Eeze- up to three times per day Cough drops, alcohol free Flonase (  by prescription only) Guaifenesin Mucinex Robitussin DM (plain only, alcohol free) Saline nasal spray/drops Sudafed (pseudoephedrine) & Actifed ** use only after [redacted] weeks gestation and if you do not have high blood pressure Tylenol Vicks Vaporub Zinc lozenges Zyrtec   Constipation: Colace Ducolax suppositories Fleet enema Glycerin suppositories Metamucil Milk of magnesia Miralax Senokot Smooth move tea  Diarrhea: Kaopectate Imodium A-D  *NO pepto Bismol  Hemorrhoids: Anusol Anusol HC Preparation H Tucks  Indigestion: Tums Maalox Mylanta Zantac  Pepcid  Insomnia: Benadryl (alcohol free) 25mg  every 6 hours as needed Tylenol PM Unisom, no  Gelcaps  Leg Cramps: Tums MagGel  Nausea/Vomiting:  Bonine Dramamine Emetrol Ginger extract Sea bands Meclizine  Nausea medication to take during pregnancy:  Unisom (doxylamine succinate 25 mg tablets) Take one tablet daily at bedtime. If symptoms are not adequately controlled, the dose can be increased to a maximum recommended dose of two tablets daily (1/2 tablet in the morning, 1/2 tablet mid-afternoon and one at bedtime). Vitamin B6 100mg  tablets. Take one tablet twice a day (up to 200 mg per day).  Skin Rashes: Aveeno products Benadryl cream or 25mg  every 6 hours as needed Calamine Lotion 1% cortisone cream  Yeast infection: Gyne-lotrimin 7 Monistat 7   **If taking multiple medications, please check labels to avoid duplicating the same active ingredients **take medication as directed on the label ** Do not exceed 4000 mg of tylenol in 24 hours **Do not take medications that contain aspirin or ibuprofen

## 2018-01-07 ENCOUNTER — Ambulatory Visit (HOSPITAL_COMMUNITY): Admission: RE | Admit: 2018-01-07 | Payer: Medicaid Other | Source: Ambulatory Visit

## 2018-01-07 DIAGNOSIS — Z3689 Encounter for other specified antenatal screening: Secondary | ICD-10-CM | POA: Insufficient documentation

## 2018-01-07 DIAGNOSIS — O99212 Obesity complicating pregnancy, second trimester: Secondary | ICD-10-CM | POA: Insufficient documentation

## 2018-01-07 DIAGNOSIS — Z3A18 18 weeks gestation of pregnancy: Secondary | ICD-10-CM | POA: Insufficient documentation

## 2018-01-21 ENCOUNTER — Encounter: Payer: Self-pay | Admitting: General Practice

## 2018-01-21 NOTE — Progress Notes (Unsigned)
Patient triggered in BRX for BP 153/97. Per Dr Adrian BlackwaterStinson, will bring in patient for BP check visit this week. Front office will contact the patient for an appt

## 2018-01-23 ENCOUNTER — Telehealth: Payer: Self-pay

## 2018-01-23 DIAGNOSIS — Z3490 Encounter for supervision of normal pregnancy, unspecified, unspecified trimester: Secondary | ICD-10-CM

## 2018-01-23 NOTE — Telephone Encounter (Signed)
-----   Message from Armando ReichertHeather D Hogan, CNM sent at 01/11/2018 10:35 AM EST ----- Patient needs FU on anatomy US around 02/08/18. She is a Development worker, international aidbaby scripts patient, and does not have an appt prior to then. Please schedule this, and call the patient.  Thank you,  Herbert SetaHeather

## 2018-01-23 NOTE — Telephone Encounter (Signed)
Attempted to call MFM - phone rings busy. Armandina StammerJennifer Sherissa Tenenbaum RN   Appointment scheduled for April 8th at 12:45. Patient has OB appointment on that day at 11:15 Patient called and made aware of ultrasound appointment on April 8th  at 1245. Armandina StammerJennifer Shena Vinluan RN

## 2018-01-24 ENCOUNTER — Ambulatory Visit: Payer: Medicaid Other

## 2018-02-10 ENCOUNTER — Ambulatory Visit (HOSPITAL_COMMUNITY): Admission: RE | Admit: 2018-02-10 | Payer: Medicaid Other | Source: Ambulatory Visit

## 2018-02-10 ENCOUNTER — Encounter: Payer: Medicaid Other | Admitting: Advanced Practice Midwife

## 2018-02-26 ENCOUNTER — Encounter: Payer: Self-pay | Admitting: Family Medicine

## 2018-02-27 ENCOUNTER — Encounter: Payer: Self-pay | Admitting: General Practice

## 2018-03-11 ENCOUNTER — Other Ambulatory Visit: Payer: Self-pay | Admitting: *Deleted

## 2018-03-11 ENCOUNTER — Ambulatory Visit (INDEPENDENT_AMBULATORY_CARE_PROVIDER_SITE_OTHER): Payer: Medicaid Other | Admitting: Student

## 2018-03-11 VITALS — BP 119/72 | HR 96 | Wt 232.7 lb

## 2018-03-11 DIAGNOSIS — Z3403 Encounter for supervision of normal first pregnancy, third trimester: Secondary | ICD-10-CM

## 2018-03-11 DIAGNOSIS — Z3480 Encounter for supervision of other normal pregnancy, unspecified trimester: Secondary | ICD-10-CM

## 2018-03-11 NOTE — Patient Instructions (Signed)

## 2018-03-11 NOTE — Progress Notes (Signed)
Not fasting today, agreeable to coming 03/14/18 fasting for 2hr gtt/ 28 wk labs. Would like to get tdap when she gets her bloodwork on her day off.

## 2018-03-11 NOTE — Progress Notes (Signed)
   PRENATAL VISIT NOTE  Subjective:  Teresa Todd is a 22 y.o. G4P1021 at [redacted]w[redacted]d being seen today for ongoing prenatal care.  She is currently monitored for the following issues for this low-risk pregnancy and has Supervision of other normal pregnancy, antepartum; Short interval between pregnancies complicating pregnancy, antepartum, second trimester; [redacted] weeks gestation of pregnancy; Obesity affecting pregnancy in second trimester; and Encounter for fetal anatomic survey on their problem list.  Patient reports no complaints.  Contractions: Not present. Vag. Bleeding: None.  Movement: Present. Denies leaking of fluid.   The following portions of the patient's history were reviewed and updated as appropriate: allergies, current medications, past family history, past medical history, past social history, past surgical history and problem list. Problem list updated.  Objective:   Vitals:   03/11/18 0935  BP: 119/72  Pulse: 96  Weight: 232 lb 11.2 oz (105.6 kg)    Fetal Status: Fetal Heart Rate (bpm): 150 Fundal Height: 29 cm Movement: Present     General:  Alert, oriented and cooperative. Patient is in no acute distress.  Skin: Skin is warm and dry. No rash noted.   Cardiovascular: Normal heart rate noted  Respiratory: Normal respiratory effort, no problems with respiration noted  Abdomen: Soft, gravid, appropriate for gestational age.  Pain/Pressure: Present     Pelvic: Cervical exam deferred        Extremities: Normal range of motion.  Edema: Trace  Mental Status: Normal mood and affect. Normal behavior. Normal judgment and thought content.   Assessment and Plan:  Pregnancy: G4P1021 at [redacted]w[redacted]d  1. Supervision of other normal pregnancy, antepartum -baby removed from BRX d/t not sending in data. Discussed with patient. Has had difficulty d/t starting new job. Is understanding regarding process & need to be removed -not fasting today. Will return this Friday for 28 wk labs &  tdap  Preterm labor symptoms and general obstetric precautions including but not limited to vaginal bleeding, contractions, leaking of fluid and fetal movement were reviewed in detail with the patient. Please refer to After Visit Summary for other counseling recommendations.  Return in about 2 weeks (around 03/25/2018) for 03/14/18 2hr gtt/28wk labs/tdap.  No future appointments.  Judeth Horn, NP

## 2018-03-14 ENCOUNTER — Other Ambulatory Visit: Payer: Medicaid Other

## 2018-03-18 ENCOUNTER — Ambulatory Visit (HOSPITAL_COMMUNITY): Payer: Medicaid Other | Attending: Advanced Practice Midwife

## 2018-03-20 ENCOUNTER — Other Ambulatory Visit: Payer: Medicaid Other

## 2018-03-20 DIAGNOSIS — Z3403 Encounter for supervision of normal first pregnancy, third trimester: Secondary | ICD-10-CM

## 2018-03-21 LAB — CBC
HEMOGLOBIN: 11.6 g/dL (ref 11.1–15.9)
Hematocrit: 34.7 % (ref 34.0–46.6)
MCH: 30.9 pg (ref 26.6–33.0)
MCHC: 33.4 g/dL (ref 31.5–35.7)
MCV: 93 fL (ref 79–97)
Platelets: 282 10*3/uL (ref 150–379)
RBC: 3.75 x10E6/uL — AB (ref 3.77–5.28)
RDW: 13.7 % (ref 12.3–15.4)
WBC: 9.6 10*3/uL (ref 3.4–10.8)

## 2018-03-21 LAB — GLUCOSE TOLERANCE, 2 HOURS W/ 1HR
GLUCOSE, FASTING: 68 mg/dL (ref 65–91)
Glucose, 1 hour: 88 mg/dL (ref 65–179)
Glucose, 2 hour: 53 mg/dL — ABNORMAL LOW (ref 65–152)

## 2018-03-21 LAB — HIV ANTIBODY (ROUTINE TESTING W REFLEX): HIV Screen 4th Generation wRfx: NONREACTIVE

## 2018-03-21 LAB — RPR: RPR Ser Ql: NONREACTIVE

## 2018-03-25 ENCOUNTER — Telehealth: Payer: Self-pay | Admitting: General Practice

## 2018-03-25 ENCOUNTER — Encounter: Payer: Medicaid Other | Admitting: Medical

## 2018-03-25 NOTE — Telephone Encounter (Signed)
Patient was a No Show for today.  Called patient to reschedule, but no answer.  Left message on VM for patient to give our office a call back to reschedule.

## 2018-04-23 ENCOUNTER — Ambulatory Visit (INDEPENDENT_AMBULATORY_CARE_PROVIDER_SITE_OTHER): Payer: Medicaid Other | Admitting: Advanced Practice Midwife

## 2018-04-23 ENCOUNTER — Encounter: Payer: Self-pay | Admitting: Advanced Practice Midwife

## 2018-04-23 VITALS — BP 123/74 | HR 88 | Wt 242.0 lb

## 2018-04-23 DIAGNOSIS — Z3483 Encounter for supervision of other normal pregnancy, third trimester: Secondary | ICD-10-CM

## 2018-04-23 DIAGNOSIS — Z23 Encounter for immunization: Secondary | ICD-10-CM

## 2018-04-23 DIAGNOSIS — O99213 Obesity complicating pregnancy, third trimester: Secondary | ICD-10-CM

## 2018-04-23 DIAGNOSIS — O09892 Supervision of other high risk pregnancies, second trimester: Secondary | ICD-10-CM

## 2018-04-23 DIAGNOSIS — O99212 Obesity complicating pregnancy, second trimester: Secondary | ICD-10-CM

## 2018-04-23 DIAGNOSIS — Z3480 Encounter for supervision of other normal pregnancy, unspecified trimester: Secondary | ICD-10-CM

## 2018-04-23 DIAGNOSIS — O09893 Supervision of other high risk pregnancies, third trimester: Secondary | ICD-10-CM

## 2018-04-23 NOTE — Patient Instructions (Signed)

## 2018-04-23 NOTE — Progress Notes (Signed)
   PRENATAL VISIT NOTE  Subjective:  Teresa Todd is a 22 y.o. [id number] at 6846w1d being seen today for ongoing prenatal care.  She is currently monitored for the following issues for this low-risk pregnancy and has Supervision of other normal pregnancy, antepartum; Short interval between pregnancies complicating pregnancy, antepartum, second trimester; [redacted] weeks gestation of pregnancy; Obesity affecting pregnancy in second trimester; and Encounter for fetal anatomic survey on their problem list.  Patient reports vaginal irritation and patient has noticed some painful "bumps" on her labia that come and go..  Contractions: Irritability. Vag. Bleeding: None.  Movement: Present. Denies leaking of fluid.   The following portions of the patient's history were reviewed and updated as appropriate: allergies, current medications, past family history, past medical history, past social history, past surgical history and problem list. Problem list updated.  Objective:   Vitals:   04/23/18 1440  BP: 123/74  Pulse: 88  Weight: 242 lb (109.8 kg)    Fetal Status: Fetal Heart Rate (bpm): 156 Fundal Height: 34 cm Movement: Present     General:  Alert, oriented and cooperative. Patient is in no acute distress.  Skin: Skin is warm and dry. No rash noted.   Cardiovascular: Normal heart rate noted  Respiratory: Normal respiratory effort, no problems with respiration noted  Abdomen: Soft, gravid, appropriate for gestational age.  Pain/Pressure: Present     Pelvic: Cervical exam deferred       small infected hair follicles   Extremities: Normal range of motion.  Edema: Trace  Mental Status: Normal mood and affect. Normal behavior. Normal judgment and thought content.   Assessment and Plan:  Pregnancy: G4P1021 at 3346w1d  1. Supervision of other normal pregnancy, antepartum - Routine care - GBS at [facility] - FU US on 04/30/18   2. Folliculitis - discussed comfort measures - likely from shaving encouraged  patient to decrease shaving   Preterm labor symptoms and general obstetric precautions including but not limited to vaginal bleeding, contractions, leaking of fluid and fetal movement were reviewed in detail with the patient. Please refer to After Visit Summary for other counseling recommendations.  Return in about 2 weeks (around 05/07/2018).  Future Appointments  Date Time Provider Department Center  04/30/2018  8:45 AM [facility] US 2 [facility]    Thressa ShellerHeather Hogan, CNM

## 2018-04-30 ENCOUNTER — Other Ambulatory Visit: Payer: Self-pay | Admitting: Advanced Practice Midwife

## 2018-04-30 ENCOUNTER — Ambulatory Visit (HOSPITAL_COMMUNITY)
Admission: RE | Admit: 2018-04-30 | Discharge: 2018-04-30 | Disposition: A | Discharge status: Home / Self Care | Payer: Medicaid Other | Source: Ambulatory Visit | Attending: Advanced Practice Midwife | Admitting: Advanced Practice Midwife

## 2018-04-30 DIAGNOSIS — Z3490 Encounter for supervision of normal pregnancy, unspecified, unspecified trimester: Secondary | ICD-10-CM

## 2018-04-30 DIAGNOSIS — Z362 Encounter for other antenatal screening follow-up: Secondary | ICD-10-CM

## 2018-04-30 DIAGNOSIS — O99213 Obesity complicating pregnancy, third trimester: Secondary | ICD-10-CM | POA: Diagnosis not present

## 2018-04-30 DIAGNOSIS — Z3A35 35 weeks gestation of pregnancy: Secondary | ICD-10-CM

## 2018-04-30 DIAGNOSIS — Z0489 Encounter for examination and observation for other specified reasons: Secondary | ICD-10-CM

## 2018-04-30 DIAGNOSIS — IMO0002 Reserved for concepts with insufficient information to code with codable children: Secondary | ICD-10-CM

## 2018-04-30 DIAGNOSIS — Z3A34 34 weeks gestation of pregnancy: Secondary | ICD-10-CM

## 2018-04-30 DIAGNOSIS — O09893 Supervision of other high risk pregnancies, third trimester: Secondary | ICD-10-CM | POA: Insufficient documentation

## 2018-05-09 ENCOUNTER — Encounter: Payer: Medicaid Other | Admitting: Obstetrics and Gynecology

## 2018-05-14 ENCOUNTER — Telehealth: Payer: Self-pay

## 2018-05-14 ENCOUNTER — Ambulatory Visit (INDEPENDENT_AMBULATORY_CARE_PROVIDER_SITE_OTHER): Payer: Medicaid Other | Admitting: Obstetrics and Gynecology

## 2018-05-14 ENCOUNTER — Other Ambulatory Visit (HOSPITAL_COMMUNITY)
Admission: RE | Admit: 2018-05-14 | Discharge: 2018-05-14 | Disposition: A | Discharge status: Home / Self Care | Payer: Medicaid Other | Source: Ambulatory Visit | Attending: Obstetrics and Gynecology | Admitting: Obstetrics and Gynecology

## 2018-05-14 VITALS — BP 119/78 | HR 100 | Wt 245.5 lb

## 2018-05-14 DIAGNOSIS — Z3480 Encounter for supervision of other normal pregnancy, unspecified trimester: Secondary | ICD-10-CM

## 2018-05-14 DIAGNOSIS — O09892 Supervision of other high risk pregnancies, second trimester: Secondary | ICD-10-CM

## 2018-05-14 DIAGNOSIS — O09893 Supervision of other high risk pregnancies, third trimester: Secondary | ICD-10-CM

## 2018-05-14 DIAGNOSIS — E669 Obesity, unspecified: Secondary | ICD-10-CM

## 2018-05-14 DIAGNOSIS — O99213 Obesity complicating pregnancy, third trimester: Secondary | ICD-10-CM

## 2018-05-14 DIAGNOSIS — O99212 Obesity complicating pregnancy, second trimester: Secondary | ICD-10-CM

## 2018-05-14 NOTE — Patient Instructions (Signed)

## 2018-05-14 NOTE — Telephone Encounter (Signed)
Called pt to advise of US appt scheduled for 05/23/18 @ 3:45p. Pt verbalized understanding.

## 2018-05-14 NOTE — Progress Notes (Signed)
Subjective:  Sherrine Maplesnyllia Atkins-Lewis is a 22 y.o. [id number] at 2524w1d being seen today for ongoing prenatal care.  She is currently monitored for the following issues for this low-risk pregnancy and has Supervision of other normal pregnancy, antepartum; Short interval between pregnancies complicating pregnancy, antepartum, second trimester; [redacted] weeks gestation of pregnancy; Obesity affecting pregnancy in second trimester; Encounter for fetal anatomic survey; Prenatal care, antepartum; [redacted] weeks gestation of pregnancy; Encounter for other antenatal screening follow-up; Evaluate anatomy not seen on prior sonogram; and [redacted] weeks gestation of pregnancy on their problem list.  Patient reports edema with some hyperpigmented discoloration on medial surface of right ankle, that has ligthened since appearing.Contractions: None reported;  Irritability: none reported;  Vag. Bleeding: None.  Movement: Present. Denies leaking of fluid.   The following portions of the patient's history were reviewed and updated as appropriate: allergies, current medications, past family history, past medical history, past social history, past surgical history and problem list. Problem list updated.  Objective:   Vitals:   05/14/18 0858  BP: 119/78  Pulse: 100  Weight: 245 lb 8 oz (111.4 kg)    Fetal Status: Fetal Heart Rate (bpm): 145 Fundal Height: 36 cm Movement: Present  Presentation: Vertex  General:  Alert, oriented and cooperative. Patient is in no acute distress.  Skin: Skin is warm and dry. No rash noted.   Cardiovascular: Normal heart rate noted  Respiratory: Normal respiratory effort, no problems with respiration noted  Abdomen: Soft, gravid, appropriate for gestational age. Pain/Pressure: Absent     Pelvic: Vag. Bleeding: None     Cervical exam performed Dilation: 1.5 Effacement (%): 40 Station: -2  Extremities: Normal range of motion.  Edema: Trace  Mental Status: Normal mood and affect. Normal behavior. Normal  judgment and thought content.     Assessment and Plan:  Pregnancy: [id number] at 5124w1d progressing well with minimal complaints of edema  1. Supervision of other normal pregnancy, antepartum Doing well. Has some edema dicussed elevation of legs and compression stockings. 36wk cultures collected.  - [facility]Chlam[facility] - Culture, beta strep (group b only)  2. Short interval between pregnancies complicating pregnancy, antepartum, second trimester  3. Obesity affecting pregnancy in second trimester Mother counseled about continuing to exercise (walking 12 minutes to and from work), as well as working in time during her lunch break to walk 10-3515minutes; will schedule for ultrasound to track growth of baby. TWG this pregnancy 19lbs.   Term labor symptoms and general obstetric precautions including but not limited to vaginal bleeding, contractions, leaking of fluid and fetal movement were reviewed in detail with the patient.  Please refer to After Visit Summary for other counseling recommendations.  Return in about 1 week (around 05/21/2018) for ob visit.   Chukwu, Dolores Pattyhika I, Medical Student

## 2018-05-14 NOTE — Progress Notes (Signed)
Pt states in the last couple of days angles have been swelling & bruising. Has been soaking & elevating feet and swelling & bruising has gone down.

## 2018-05-15 LAB — GC/CHLAMYDIA PROBE AMP (~~LOC~~) NOT AT ARMC
Chlamydia: NEGATIVE
NEISSERIA GONORRHEA: NEGATIVE

## 2018-05-18 LAB — CULTURE, BETA STREP (GROUP B ONLY): Strep Gp B Culture: NEGATIVE

## 2018-05-21 ENCOUNTER — Ambulatory Visit (INDEPENDENT_AMBULATORY_CARE_PROVIDER_SITE_OTHER): Payer: Medicaid Other | Admitting: Medical

## 2018-05-21 ENCOUNTER — Encounter: Payer: Self-pay | Admitting: Medical

## 2018-05-21 VITALS — BP 123/85 | HR 80 | Wt 250.9 lb

## 2018-05-21 DIAGNOSIS — Z3480 Encounter for supervision of other normal pregnancy, unspecified trimester: Secondary | ICD-10-CM

## 2018-05-21 DIAGNOSIS — N949 Unspecified condition associated with female genital organs and menstrual cycle: Secondary | ICD-10-CM

## 2018-05-21 DIAGNOSIS — O99212 Obesity complicating pregnancy, second trimester: Secondary | ICD-10-CM

## 2018-05-21 MED ORDER — COMFORT FIT MATERNITY SUPP LG MISC: 1.0000 [IU] | Freq: Every day | 0 refills | Status: DC | PRN

## 2018-05-21 NOTE — Progress Notes (Signed)
   PRENATAL VISIT NOTE  Subjective:  Teresa Todd is a 22 y.o. [id number] at 6167w1d being seen today for ongoing prenatal care.  She is currently monitored for the following issues for this low-risk pregnancy and has Supervision of other normal pregnancy, antepartum; Short interval between pregnancies complicating pregnancy, antepartum, second trimester; and Obesity affecting pregnancy in second trimester on their problem list.  Patient reports occasional abdominal pain.  Contractions: Not present. Vag. Bleeding: None.  Movement: Present. Denies leaking of fluid.   The following portions of the patient's history were reviewed and updated as appropriate: allergies, current medications, past family history, past medical history, past social history, past surgical history and problem list. Problem list updated.  Objective:   Vitals:   05/21/18 1142  BP: 123/85  Pulse: 80  Weight: 250 lb 14.4 oz (113.8 kg)    Fetal Status: Fetal Heart Rate (bpm): 145 Fundal Height: 38 cm Movement: Present     General:  Alert, oriented and cooperative. Patient is in no acute distress.  Skin: Skin is warm and dry. No rash noted.   Cardiovascular: Normal heart rate noted  Respiratory: Normal respiratory effort, no problems with respiration noted  Abdomen: Soft, gravid, appropriate for gestational age.  Pain/Pressure: Present     Pelvic: Cervical exam deferred        Extremities: Normal range of motion.  Edema: Trace  Mental Status: Normal mood and affect. Normal behavior. Normal judgment and thought content.   Assessment and Plan:  Pregnancy: G4P1021 at 2967w1d  1. Supervision of other normal pregnancy, antepartum - Doing well  2. Obesity affecting pregnancy in second trimester - appropriate weight gain   3. Round ligament pain - discussed hydrotherapy, avoiding heavy lifting or prolonged standing and Tylenol PRN - Elastic Bandages & Supports (COMFORT FIT MATERNITY SUPP LG) MISC; 1 Units by Does  not apply route daily as needed.  Dispense: 1 each; Refill: 0  Term labor symptoms and general obstetric precautions including but not limited to vaginal bleeding, contractions, leaking of fluid and fetal movement were reviewed in detail with the patient. Please refer to After Visit Summary for other counseling recommendations.  Return in about 1 week (around 05/28/2018) for LOB.  Future Appointments  Date Time Provider Department Center  05/23/2018  3:45 PM [facility] US 2 [facility]US  05/28/2018  8:15 AM Judeth HornLawrence, Erin, [facility]    Vonzella NippleJulie Sue Mcalexander, PA-C

## 2018-05-21 NOTE — Patient Instructions (Signed)
Research childbirth classes and hospital preregistration at [email or web address]  Fetal Movement Counts Patient Name: ________________________________________________ Patient Due Date: ____________________ What is a fetal movement count? A fetal movement count is the number of times that you feel your baby move during a certain amount of time. This may also be called a fetal kick count. A fetal movement count is recommended for every pregnant woman. You may be asked to start counting fetal movements as early as week 28 of your pregnancy. Pay attention to when your baby is most active. You may notice your baby's sleep and wake cycles. You may also notice things that make your baby move more. You should do a fetal movement count:  When your baby is normally most active.  At the same time each day.  A good time to count movements is while you are resting, after having something to eat and drink. How do I count fetal movements? 1. Find a quiet, comfortable area. Sit, or lie down on your side. 2. Write down the date, the start time and stop time, and the number of movements that you felt between those two times. Take this information with you to your health care visits. 3. For 2 hours, count kicks, flutters, swishes, rolls, and jabs. You should feel at least 10 movements during 2 hours. 4. You may stop counting after you have felt 10 movements. 5. If you do not feel 10 movements in 2 hours, have something to eat and drink. Then, keep resting and counting for 1 hour. If you feel at least 4 movements during that hour, you may stop counting. Contact a health care provider if:  You feel fewer than 4 movements in 2 hours.  Your baby is not moving like he or she usually does. Date: ____________ Start time: ____________ Stop time: ____________ Movements: ____________ Date: ____________ Start time: ____________ Stop time: ____________ Movements: ____________ Date: ____________ Start time: ____________  Stop time: ____________ Movements: ____________ Date: ____________ Start time: ____________ Stop time: ____________ Movements: ____________ Date: ____________ Start time: ____________ Stop time: ____________ Movements: ____________ Date: ____________ Start time: ____________ Stop time: ____________ Movements: ____________ Date: ____________ Start time: ____________ Stop time: ____________ Movements: ____________ Date: ____________ Start time: ____________ Stop time: ____________ Movements: ____________ Date: ____________ Start time: ____________ Stop time: ____________ Movements: ____________ This information is not intended to replace advice given to you by your health care provider. Make sure you discuss any questions you have with your health care provider. Document Released: 11/21/2006 Document Revised: 06/20/2016 Document Reviewed: 12/01/2015 Elsevier Interactive Patient Education  2018 [employer]  Braxton Hicks Contractions Contractions of the uterus can occur throughout pregnancy, but they are not always a sign that you are in labor. You may have practice contractions called Braxton Hicks contractions. These false labor contractions are sometimes confused with true labor. What are Braxton Hicks contractions? Braxton Hicks contractions are tightening movements that occur in the muscles of the uterus before labor. Unlike true labor contractions, these contractions do not result in opening (dilation) and thinning of the cervix. Toward the end of pregnancy (32-34 weeks), Braxton Hicks contractions can happen more often and may become stronger. These contractions are sometimes difficult to tell apart from true labor because they can be very uncomfortable. You should not feel embarrassed if you go to the hospital with false labor. Sometimes, the only way to tell if you are in true labor is for your health care provider to look for changes in the cervix. The health care provider will   do a physical  exam and may monitor your contractions. If you are not in true labor, the exam should show that your cervix is not dilating and your water has not broken. If there are other health problems associated with your pregnancy, it is completely safe for you to be sent home with false labor. You may continue to have Braxton Hicks contractions until you go into true labor. How to tell the difference between true labor and false labor True labor  Contractions last 30-70 seconds.  Contractions become very regular.  Discomfort is usually felt in the top of the uterus, and it spreads to the lower abdomen and low back.  Contractions do not go away with walking.  Contractions usually become more intense and increase in frequency.  The cervix dilates and gets thinner. False labor  Contractions are usually shorter and not as strong as true labor contractions.  Contractions are usually irregular.  Contractions are often felt in the front of the lower abdomen and in the groin.  Contractions may go away when you walk around or change positions while lying down.  Contractions get weaker and are shorter-lasting as time goes on.  The cervix usually does not dilate or become thin. Follow these instructions at home:  Take over-the-counter and prescription medicines only as told by your health care provider.  Keep up with your usual exercises and follow other instructions from your health care provider.  Eat and drink lightly if you think you are going into labor.  If Braxton Hicks contractions are making you uncomfortable: ? Change your position from lying down or resting to walking, or change from walking to resting. ? Sit and rest in a tub of warm water. ? Drink enough fluid to keep your urine pale yellow. Dehydration may cause these contractions. ? Do slow and deep breathing several times an hour.  Keep all follow-up prenatal visits as told by your health care provider. This is  important. Contact a health care provider if:  You have a fever.  You have continuous pain in your abdomen. Get help right away if:  Your contractions become stronger, more regular, and closer together.  You have fluid leaking or gushing from your vagina.  You pass blood-tinged mucus (bloody show).  You have bleeding from your vagina.  You have low back pain that you never had before.  You feel your baby's head pushing down and causing pelvic pressure.  Your baby is not moving inside you as much as it used to. Summary  Contractions that occur before labor are called Braxton Hicks contractions, false labor, or practice contractions.  Braxton Hicks contractions are usually shorter, weaker, farther apart, and less regular than true labor contractions. True labor contractions usually become progressively stronger and regular and they become more frequent.  Manage discomfort from Braxton Hicks contractions by changing position, resting in a warm bath, drinking plenty of water, or practicing deep breathing. This information is not intended to replace advice given to you by your health care provider. Make sure you discuss any questions you have with your health care provider. Document Released: 03/07/2017 Document Revised: 03/07/2017 Document Reviewed: 03/07/2017 Elsevier Interactive Patient Education  2018 Elsevier Inc.    

## 2018-05-22 ENCOUNTER — Encounter: Payer: Self-pay | Admitting: *Deleted

## 2018-05-23 ENCOUNTER — Ambulatory Visit (HOSPITAL_COMMUNITY): Payer: Medicaid Other

## 2018-05-26 ENCOUNTER — Ambulatory Visit (HOSPITAL_COMMUNITY): Payer: Medicaid Other

## 2018-05-28 ENCOUNTER — Ambulatory Visit (INDEPENDENT_AMBULATORY_CARE_PROVIDER_SITE_OTHER): Payer: Medicaid Other | Admitting: Student

## 2018-05-28 DIAGNOSIS — Z3480 Encounter for supervision of other normal pregnancy, unspecified trimester: Secondary | ICD-10-CM

## 2018-05-28 NOTE — Progress Notes (Signed)
   PRENATAL VISIT NOTE  Subjective:  Teresa Todd is a 22 y.o. [id number] at 2936w1d being seen today for ongoing prenatal care.  She is currently monitored for the following issues for this low-risk pregnancy and has Supervision of other normal pregnancy, antepartum; Short interval between pregnancies complicating pregnancy, antepartum, second trimester; and Obesity affecting pregnancy in second trimester on their problem list.  Patient reports no complaints.  Contractions: Not present. Vag. Bleeding: None.  Movement: Present. Denies leaking of fluid.   The following portions of the patient's history were reviewed and updated as appropriate: allergies, current medications, past family history, past medical history, past social history, past surgical history and problem list. Problem list updated.  Objective:   Vitals:   05/28/18 0840  BP: 136/88  Pulse: 89  Weight: 254 lb (115.2 kg)    Fetal Status: Fetal Heart Rate (bpm): 148 Fundal Height: 37 cm Movement: Present  Presentation: Vertex  General:  Alert, oriented and cooperative. Patient is in no acute distress.  Skin: Skin is warm and dry. No rash noted.   Cardiovascular: Normal heart rate noted  Respiratory: Normal respiratory effort, no problems with respiration noted  Abdomen: Soft, gravid, appropriate for gestational age.  Pain/Pressure: Present     Pelvic: Cervical exam performed Dilation: 2.5 Effacement (%): 50 Station: -2  Extremities: Normal range of motion.  Edema: None  Mental Status: Normal mood and affect. Normal behavior. Normal judgment and thought content.   Assessment and Plan:  Pregnancy: G4P1021 at 5336w1d  1. Supervision of other normal pregnancy, antepartum -doing well -discussed expectations at upcoming visit -- will discuss IOL at next visit  Term labor symptoms and general obstetric precautions including but not limited to vaginal bleeding, contractions, leaking of fluid and fetal movement were reviewed  in detail with the patient. Please refer to After Visit Summary for other counseling recommendations.  Return in about 1 week (around 06/04/2018) for Routine OB.  No future appointments.  Judeth HornErin Iysis Germain, NP

## 2018-05-28 NOTE — Patient Instructions (Signed)
Before Baby Comes Home  Before your baby arrives it is important to:   Have all of the supplies that you will need to care for your baby.   Know where to go if there is an emergency.   Discuss the baby's arrival with other family members.    What supplies will I need?    It is recommended that you have the following supplies:  Large Items   Crib.   Crib mattress.   Rear-facing infant car seat. If possible, have a trained professional check to make sure that it is installed correctly.    Feeding   6-8 bottles that are 4-5 oz in size.   6-8 nipples.   Bottle brush.   Sterilizer, or a large pan or kettle with a lid.   A way to boil and cool water.   If you will be breastfeeding:  ? Breast pump.  ? Nipple cream.  ? Nursing bra.  ? Breast pads.  ? Breast shields.   If you will be formula feeding:  ? Formula.  ? Measuring cups.  ? Measuring spoons.    Bathing   Mild baby soap and baby shampoo.   Petroleum jelly.   Soft cloth towel and washcloth.   Hooded towel.   Cotton balls.   Bath basin.    Other Supplies   Rectal thermometer.   Bulb syringe.   Baby wipes or washcloths for diaper changes.   Diaper bag.   Changing pad.   Clothing, including one-piece outfits and pajamas.   Baby nail clippers.   Receiving blankets.   Mattress pad and sheets for the crib.   Night-light for the baby's room.   Baby monitor.   2 or 3 pacifiers.   Either 24-36 cloth diapers and waterproof diaper covers or a box of disposable diapers. You may need to use as many as 10-12 diapers per day.    How do I prepare for an emergency?  Prepare for an emergency by:   Knowing how to get to the nearest hospital.   Listing the phone numbers of your baby's health care providers near your home phone and in your cell phone.    How do I prepare my family?   Decide how to handle visitors.   If you have other children:  ? Talk with them about the baby coming home. Ask them how they feel about it.  ? Read a book together about  being a new big brother or sister.  ? Find ways to let them help you prepare for the new baby.  ? Have someone ready to care for them while you are in the hospital.  This information is not intended to replace advice given to you by your health care provider. Make sure you discuss any questions you have with your health care provider.  Document Released: 10/04/2008 Document Revised: 03/29/2016 Document Reviewed: 09/29/2014  Elsevier Interactive Patient Education  2018 [employer] Inc[employer]

## 2018-06-03 ENCOUNTER — Telehealth (HOSPITAL_COMMUNITY): Payer: Self-pay | Admitting: *Deleted

## 2018-06-03 ENCOUNTER — Ambulatory Visit (INDEPENDENT_AMBULATORY_CARE_PROVIDER_SITE_OTHER): Payer: Medicaid Other | Admitting: Student

## 2018-06-03 ENCOUNTER — Encounter: Payer: Self-pay | Admitting: General Practice

## 2018-06-03 ENCOUNTER — Other Ambulatory Visit: Payer: Self-pay | Admitting: Student

## 2018-06-03 DIAGNOSIS — Z3480 Encounter for supervision of other normal pregnancy, unspecified trimester: Secondary | ICD-10-CM

## 2018-06-03 NOTE — Progress Notes (Signed)
   PRENATAL VISIT NOTE  Subjective:  Teresa Todd is a 22 y.o. [id number] at 2911w0d being seen today for ongoing prenatal care.  She is currently monitored for the following issues for this low-risk pregnancy and has Supervision of other normal pregnancy, antepartum; Short interval between pregnancies complicating pregnancy, antepartum, second trimester; and Obesity affecting pregnancy in second trimester on their problem list.  Patient reports no complaints.  Contractions: Irregular. Vag. Bleeding: None.  Movement: Present. Denies leaking of fluid.  Patient requested cervical exam.  The following portions of the patient's history were reviewed and updated as appropriate: allergies, current medications, past family history, past medical history, past social history, past surgical history and problem list. Problem list updated.  Objective:   Vitals:   06/03/18 1126 06/03/18 1159  BP: (!) 138/103 138/78  Pulse: (!) 111   Weight: 253 lb (114.8 kg)     Fetal Status: Fetal Heart Rate (bpm): 138 Fundal Height: 38 cm Movement: Present  Presentation: Vertex  General:  Alert, oriented and cooperative. Patient is in no acute distress.  Skin: Skin is warm and dry. No rash noted.   Cardiovascular: Normal heart rate noted  Respiratory: Normal respiratory effort, no problems with respiration noted  Abdomen: Soft, gravid, appropriate for gestational age.  Pain/Pressure: Present     Pelvic: Cervical exam performed     Station: Ballotable  Extremities: Normal range of motion.  Edema: None  Mental Status: Normal mood and affect. Normal behavior. Normal judgment and thought content.   Assessment and Plan:  Pregnancy: G4P1021 at 2811w0d  1. Supervision of other normal pregnancy, antepartum  - Schedule for Pitocin induction in one week  - NST within one week  Term labor symptoms and general obstetric precautions including but not limited to vaginal bleeding, contractions, leaking of fluid and  fetal movement were reviewed in detail with the patient. Please refer to After Visit Summary for other counseling recommendations.  Return in about 1 week (around 06/10/2018), or NST only.  Future Appointments  Date Time Provider Department Center  06/09/2018 11:15 AM [facility] NST [facility]  06/10/2018  6:30 AM [facility] ROOM [facility] None    Marylene LandKathryn Lorraine Raylen Tangonan, CNM

## 2018-06-03 NOTE — Patient Instructions (Signed)
Labor Induction Labor induction is when steps are taken to cause a pregnant woman to begin the labor process. Most women go into labor on their own between 37 weeks and 42 weeks of the pregnancy. When this does not happen or when there is a medical need, methods may be used to induce labor. Labor induction causes a pregnant woman's uterus to contract. It also causes the cervix to soften (ripen), open (dilate), and thin out (efface). Usually, labor is not induced before 39 weeks of the pregnancy unless there is a problem with the baby or mother. Before inducing labor, your health care provider will consider a number of factors, including the following:  The medical condition of you and the baby.  How many weeks along you are.  The status of the baby's lung maturity.  The condition of the cervix.  The position of the baby. What are the reasons for labor induction? Labor may be induced for the following reasons:  The health of the baby or mother is at risk.  The pregnancy is overdue by 1 week or more.  The water breaks but labor does not start on its own.  The mother has a health condition or serious illness, such as high blood pressure, infection, placental abruption, or diabetes.  The amniotic fluid amounts are low around the baby.  The baby is distressed. Convenience or wanting the baby to be born on a certain date is not a reason for inducing labor. What methods are used for labor induction? Several methods of labor induction may be used, such as:  Prostaglandin medicine. This medicine causes the cervix to dilate and ripen. The medicine will also start contractions. It can be taken by mouth or by inserting a suppository into the vagina.  Inserting a thin tube (catheter) with a balloon on the end into the vagina to dilate the cervix. Once inserted, the balloon is expanded with water, which causes the cervix to open.  Stripping the membranes. Your health care provider separates  amniotic sac tissue from the cervix, causing the cervix to be stretched and causing the release of a hormone called progesterone. This may cause the uterus to contract. It is often done during an office visit. You will be sent home to wait for the contractions to begin. You will then come in for an induction.  Breaking the water. Your health care provider makes a hole in the amniotic sac using a small instrument. Once the amniotic sac breaks, contractions should begin. This may still take hours to see an effect.  Medicine to trigger or strengthen contractions. This medicine is given through an IV access tube inserted into a vein in your arm. All of the methods of induction, besides stripping the membranes, will be done in the hospital. Induction is done in the hospital so that you and the baby can be carefully monitored. How long does it take for labor to be induced? Some inductions can take up to 2-3 days. Depending on the cervix, it usually takes less time. It takes longer when you are induced early in the pregnancy or if this is your first pregnancy. If a mother is still pregnant and the induction has been going on for 2-3 days, either the mother will be sent home or a cesarean delivery will be needed. What are the risks associated with labor induction? Some of the risks of induction include:  Changes in fetal heart rate, such as too high, too low, or erratic.  Fetal distress.    Chance of infection for the mother and baby.  Increased chance of having a cesarean delivery.  Breaking off (abruption) of the placenta from the uterus (rare).  Uterine rupture (very rare). When induction is needed for medical reasons, the benefits of induction may outweigh the risks. What are some reasons for not inducing labor? Labor induction should not be done if:  It is shown that your baby does not tolerate labor.  You have had previous surgeries on your uterus, such as a myomectomy or the removal of  fibroids.  Your placenta lies very low in the uterus and blocks the opening of the cervix (placenta previa).  Your baby is not in a head-down position.  The umbilical cord drops down into the birth canal in front of the baby. This could cut off the baby's blood and oxygen supply.  You have had a previous cesarean delivery.  There are unusual circumstances, such as the baby being extremely premature. This information is not intended to replace advice given to you by your health care provider. Make sure you discuss any questions you have with your health care provider. Document Released: 03/13/2007 Document Revised: 03/29/2016 Document Reviewed: 05/21/2013 Elsevier Interactive Patient Education  2017 Elsevier Inc.  

## 2018-06-03 NOTE — Progress Notes (Signed)
Diarrhea all morning and irregular contractions, No energy, No Dizziness.

## 2018-06-03 NOTE — Telephone Encounter (Signed)
Preadmission screen  

## 2018-06-07 ENCOUNTER — Encounter (HOSPITAL_COMMUNITY): Payer: Self-pay

## 2018-06-07 ENCOUNTER — Other Ambulatory Visit: Payer: Self-pay

## 2018-06-07 ENCOUNTER — Inpatient Hospital Stay (HOSPITAL_COMMUNITY)
Admission: AD | Admit: 2018-06-07 | Discharge: 2018-06-09 | DRG: 807 | Disposition: A | Discharge status: Home / Self Care | Payer: Medicaid Other | Attending: Obstetrics and Gynecology | Admitting: Obstetrics and Gynecology

## 2018-06-07 DIAGNOSIS — O480 Post-term pregnancy: Secondary | ICD-10-CM

## 2018-06-07 DIAGNOSIS — Z87891 Personal history of nicotine dependence: Secondary | ICD-10-CM

## 2018-06-07 DIAGNOSIS — O99214 Obesity complicating childbirth: Secondary | ICD-10-CM | POA: Diagnosis present

## 2018-06-07 DIAGNOSIS — O99212 Obesity complicating pregnancy, second trimester: Secondary | ICD-10-CM | POA: Diagnosis present

## 2018-06-07 DIAGNOSIS — O368390 Maternal care for abnormalities of the fetal heart rate or rhythm, unspecified trimester, not applicable or unspecified: Secondary | ICD-10-CM | POA: Diagnosis present

## 2018-06-07 DIAGNOSIS — O322XX0 Maternal care for transverse and oblique lie, not applicable or unspecified: Secondary | ICD-10-CM | POA: Diagnosis present

## 2018-06-07 DIAGNOSIS — Z3480 Encounter for supervision of other normal pregnancy, unspecified trimester: Secondary | ICD-10-CM

## 2018-06-07 DIAGNOSIS — Z3A40 40 weeks gestation of pregnancy: Secondary | ICD-10-CM

## 2018-06-07 DIAGNOSIS — Z3490 Encounter for supervision of normal pregnancy, unspecified, unspecified trimester: Secondary | ICD-10-CM | POA: Diagnosis present

## 2018-06-07 DIAGNOSIS — E669 Obesity, unspecified: Secondary | ICD-10-CM | POA: Diagnosis present

## 2018-06-07 DIAGNOSIS — O09892 Supervision of other high risk pregnancies, second trimester: Secondary | ICD-10-CM

## 2018-06-07 LAB — OB RESULTS CONSOLE GBS: STREP GROUP B AG: NEGATIVE

## 2018-06-07 LAB — CBC
HEMATOCRIT: 37.1 % (ref 36.0–46.0)
Hemoglobin: 12.2 g/dL (ref 12.0–15.0)
MCH: 29.8 pg (ref 26.0–34.0)
MCHC: 32.9 g/dL (ref 30.0–36.0)
MCV: 90.5 fL (ref 78.0–100.0)
Platelets: 266 10*3/uL (ref 150–400)
RBC: 4.1 MIL/uL (ref 3.87–5.11)
RDW: 13.5 % (ref 11.5–15.5)
WBC: 10.1 10*3/uL (ref 4.0–10.5)

## 2018-06-07 LAB — TYPE AND SCREEN
ABO/RH(D): O POS
Antibody Screen: NEGATIVE

## 2018-06-07 LAB — ABO/RH: ABO/RH(D): O POS

## 2018-06-07 MED ORDER — OXYTOCIN BOLUS FROM INFUSION
500.0000 mL | Freq: Once | INTRAVENOUS | Status: AC
  Administered 2018-06-07: 500 mL via INTRAVENOUS

## 2018-06-07 MED ORDER — SOD CITRATE-CITRIC ACID 500-334 MG/5ML PO SOLN: 30.0000 mL | ORAL | Status: DC | PRN

## 2018-06-07 MED ORDER — LACTATED RINGERS IV SOLN: INTRAVENOUS | Status: DC

## 2018-06-07 MED ORDER — ACETAMINOPHEN 325 MG PO TABS: 650.0000 mg | ORAL_TABLET | ORAL | Status: DC | PRN

## 2018-06-07 MED ORDER — LACTATED RINGERS IV SOLN: 500.0000 mL | INTRAVENOUS | Status: DC | PRN

## 2018-06-07 MED ORDER — ONDANSETRON HCL 4 MG/2ML IJ SOLN: 4.0000 mg | Freq: Four times a day (QID) | INTRAMUSCULAR | Status: DC | PRN

## 2018-06-07 MED ORDER — FENTANYL CITRATE (PF) 100 MCG/2ML IJ SOLN
50.0000 ug | INTRAMUSCULAR | Status: DC | PRN
  Administered 2018-06-07: 100 ug via INTRAVENOUS
  Filled 2018-06-07: qty 2

## 2018-06-07 MED ORDER — SODIUM CHLORIDE 0.9 % IV SOLN: 250.0000 mL | INTRAVENOUS | Status: DC | PRN

## 2018-06-07 MED ORDER — OXYTOCIN 10 UNIT/ML IJ SOLN
INTRAMUSCULAR | Status: AC
  Administered 2018-06-07: 10 [IU]
  Filled 2018-06-07: qty 1

## 2018-06-07 MED ORDER — SODIUM CHLORIDE 0.9% FLUSH: 3.0000 mL | Freq: Two times a day (BID) | INTRAVENOUS | Status: DC

## 2018-06-07 MED ORDER — OXYCODONE-ACETAMINOPHEN 5-325 MG PO TABS: 1.0000 | ORAL_TABLET | ORAL | Status: DC | PRN

## 2018-06-07 MED ORDER — LIDOCAINE HCL (PF) 1 % IJ SOLN
30.0000 mL | INTRAMUSCULAR | Status: DC | PRN
  Filled 2018-06-07: qty 30

## 2018-06-07 MED ORDER — OXYTOCIN 40 UNITS IN LACTATED RINGERS INFUSION - SIMPLE MED
2.5000 [IU]/h | INTRAVENOUS | Status: DC
  Filled 2018-06-07: qty 1000

## 2018-06-07 MED ORDER — OXYCODONE-ACETAMINOPHEN 5-325 MG PO TABS: 2.0000 | ORAL_TABLET | ORAL | Status: DC | PRN

## 2018-06-07 MED ORDER — IBUPROFEN 600 MG PO TABS
600.0000 mg | ORAL_TABLET | Freq: Four times a day (QID) | ORAL | Status: DC
  Administered 2018-06-07 – 2018-06-09 (×8): 600 mg via ORAL
  Filled 2018-06-07 (×8): qty 1

## 2018-06-07 MED ORDER — SODIUM CHLORIDE 0.9% FLUSH: 3.0000 mL | INTRAVENOUS | Status: DC | PRN

## 2018-06-07 NOTE — H&P (Addendum)
Teresa Todd is a 22 y.o. female [id number] at 4948w4d presenting for contractions. Was seen in MAU and noted to have prolonged decel that improved with antenatal repositioning and oxygen. Patient notes her contractions started approx 0800, noted as regular every 3-4 minutes. Denies vaginal bleeding, dysuria, vagina discharge, LOF. Endorses fetal movement.  Pregnancy has been complicated by short interval between pregnancies and obesity. Was noted last OV to be 3cm dilation.  OB History    Gravida  4   Para  1   Term  1   Preterm  0   AB  2   Living  1     SAB  1   TAB  1   Ectopic  0   Multiple  0   Live Births  1          Past Medical History:  Diagnosis Date  . Medical history non-contributory    Past Surgical History:  Procedure Laterality Date  . DILATION AND CURETTAGE OF UTERUS     Family History: family history includes Hypertension in her mother. Social History:  reports that she has quit smoking. She has never used smokeless tobacco. She reports that she does not drink alcohol or use drugs.     Maternal Diabetes: No Genetic Screening: Normal SMA, Hgb electrophoresis, CF; NIPS low low fetal fraction (declined rescreen) Maternal Ultrasounds/Referrals: Normal Fetal Ultrasounds or other Referrals:  None Maternal Substance Abuse:  No Significant Maternal Medications:  None Significant Maternal Lab Results:  None Other Comments:  None  Review of Systems  Constitutional: Negative for chills and fever.  Eyes: Negative for blurred vision and photophobia.  Respiratory: Negative for shortness of breath.   Cardiovascular: Negative for chest pain.  Gastrointestinal: Negative for vomiting.  Genitourinary: Negative for dysuria, frequency, hematuria and urgency.  Neurological: Negative for headaches.   Maternal Medical History:  Reason for admission: Contractions.   Contractions: Onset was 6-12 hours ago.   Frequency: regular.   Perceived severity is  moderate.    Fetal activity: Perceived fetal activity is normal.   Last perceived fetal movement was within the past hour.    Prenatal complications: No bleeding, PIH, infection, oligohydramnios, placental abnormality, polyhydramnios, pre-eclampsia, preterm labor or substance abuse.   Prenatal Complications - Diabetes: none.    Exam by:: Bhambri,CNM Blood pressure (!) 141/61, pulse (!) 101, temperature 97.6 F (36.4 C), temperature source Oral, resp. rate 18, weight 114.5 kg (252 lb 8 oz), last menstrual period 08/27/2017. Maternal Exam:  Uterine Assessment: Contraction strength is moderate.  Contraction frequency is regular.   Abdomen: Patient reports no abdominal tenderness. Estimated fetal weight is 3500g by Leopolds.   Fetal presentation: vertex  Introitus: Ferning test: not done.  Nitrazine test: not done. Amniotic fluid character: not assessed.  Pelvis: adequate for delivery.      Physical Exam  Constitutional: She appears well-developed and well-nourished. No distress.  HENT:  Head: Normocephalic and atraumatic.  Eyes: Pupils are equal, round, and reactive to light. EOM are normal. No scleral icterus.  Cardiovascular: Normal rate, regular rhythm and normal heart sounds.  No murmur heard. Respiratory: Effort normal and breath sounds normal. No respiratory distress. She has no wheezes.  GI: Soft. Bowel sounds are normal. There is no tenderness. There is no rebound.  Skin: Skin is warm and dry. She is not diaphoretic.    Prenatal labs: ABO, Rh: O/Positive/-- (01/28 1007) Antibody: Negative (01/28 1007) Rubella: 3.04 (01/28 1007) RPR: Non Reactive (05/16 0838)  HBsAg: Negative (01/28  1007)  HIV: Non Reactive (05/16 1610)  GBS: Neg  Assessment/Plan: Patient presenting with regular contractions. On NST, prolonged decel noted for 4 minutes at 1218 and improved with maternal repositioning and oxygen. Noted to be 5cm cervical dilation at that time, likely cause of  decel. Otherwise FHT cat 1. Will admit for labor.   Admit for labor Routine intrapartum care Patient declines epidural at this time   Candis Schatz 06/07/2018, 1:48 PM  I confirm that I have verified the information documented in the resident's note and that I have also personally reperformed the physical exam and all medical decision making activities.   Thressa Sheller 2:19 PM 06/07/18

## 2018-06-07 NOTE — Anesthesia Pain Management Evaluation Note (Signed)
  CRNA Pain Management Visit Note  Patient: Teresa Todd, 22 y.o., female  "Hello I am a member of the anesthesia team at [facility]. We have an anesthesia team available at all times to provide care throughout the hospital, including epidural management and anesthesia for C-section. I don't know your plan for the delivery whether it a natural birth, water birth, IV sedation, nitrous supplementation, doula or epidural, but we want to meet your pain goals."   1.Was your pain managed to your expectations on prior hospitalizations?   Yes   2.What is your expectation for pain management during this hospitalization?     IV pain meds  3.How can we help you reach that goal? Patient would like I V pain meds no epidural discussed with patient   Record the patient's initial score and the patient's pain goal.   Pain: 7  Pain Goal: 10 [facility] wants you to be able to say your pain was always managed very well.  Rica RecordsICKELTON,Carolene Gitto 06/07/2018

## 2018-06-07 NOTE — Progress Notes (Addendum)
Decel noted and RN Haywood LassoLynette and RN Shanda BumpsJessica in room assessing with CNM Donette LarryMelanie Bhambri. See flow sheet for detailed notes  1335:Assumed care after receiving report. Primary nurse at bs assessing. Noted O2 via face mask on at 10L, IV up with LR  G4P2 @ 40.[redacted] wksga. Presents to triage for ctx. Denies LOF or bleeding. +FM.   States ctx q3-5 mins breathing thru ctx  1340: Resident at bs assessing. O2 off per provider   1348: birthing notified. Report status of pt given. Room assigned to 167   1400 Pt to birthing room via bed.

## 2018-06-07 NOTE — MAU Note (Signed)
Marigene Ehlersnyllia Atkins-Lewis is a 22 y.o. at 7046w4d here in MAU reporting:  +contractions 3-5 minutes apart Denies lof or vb Pain score: 8-10 Vitals:   06/07/18 1309  BP: (!) 141/61  Pulse: (!) 101  Resp: 18  Temp: 97.6 F (36.4 C)     FHT:134 Lab orders placed from triage: none

## 2018-06-07 NOTE — Procedures (Signed)
Patient is 22 y.o. [id number] 443w4d admitted for IOL 2/2 NRFHT.  Prenatal course also complicated by short interval pregnancy, obesity.  Delivery Note At 5:17 PM a viable female was delivered via Vaginal, Spontaneous (Presentation: LOA, compound right hand).  APGAR: 8/;' weight pending kangaroo care  Head delivered LOA with compound R hand. No nuchal cord present. Shoulder and body delivered in usual fashion. Infant with spontaneous cry, placed on mother's abdomen, dried and bulb suctioned. Cord clamped x 2 after 1-minute delay, and cut by delivering physician. Cord blood drawn. Placenta delivered spontaneously with gentle cord traction. Fundus firm with massage and IM pitocin. Perineum inspected and found to have periurethral laceration, which was found to be hemostatic.   Anesthesia: none Episiotomy: None Lacerations: none Est. Blood Loss (mL):  200ml  Mom to postpartum.  Baby to Couplet care / Skin to Skin.  Candis SchatzPatricia Sonda Coppens, DO Family Medicine, PGY-3

## 2018-06-08 LAB — RPR: RPR: NONREACTIVE

## 2018-06-08 MED ORDER — SENNOSIDES-DOCUSATE SODIUM 8.6-50 MG PO TABS
2.0000 | ORAL_TABLET | ORAL | Status: DC
  Administered 2018-06-08 – 2018-06-09 (×2): 2 via ORAL
  Filled 2018-06-08 (×2): qty 2

## 2018-06-08 MED ORDER — PRENATAL MULTIVITAMIN CH
1.0000 | ORAL_TABLET | Freq: Every day | ORAL | Status: DC
  Administered 2018-06-08 – 2018-06-09 (×2): 1 via ORAL
  Filled 2018-06-08 (×2): qty 1

## 2018-06-08 MED ORDER — WITCH HAZEL-GLYCERIN EX PADS: 1.0000 "application " | MEDICATED_PAD | CUTANEOUS | Status: DC | PRN

## 2018-06-08 MED ORDER — BENZOCAINE-MENTHOL 20-0.5 % EX AERO
1.0000 "application " | INHALATION_SPRAY | CUTANEOUS | Status: DC | PRN
  Administered 2018-06-09: 1 via TOPICAL
  Filled 2018-06-08: qty 56

## 2018-06-08 MED ORDER — SIMETHICONE 80 MG PO CHEW: 80.0000 mg | CHEWABLE_TABLET | ORAL | Status: DC | PRN

## 2018-06-08 MED ORDER — SENNOSIDES-DOCUSATE SODIUM 8.6-50 MG PO TABS: 2.0000 | ORAL_TABLET | ORAL | 0 refills | Status: DC

## 2018-06-08 MED ORDER — COCONUT OIL OIL: 1.0000 "application " | TOPICAL_OIL | Status: DC | PRN

## 2018-06-08 MED ORDER — IBUPROFEN 600 MG PO TABS: 600.0000 mg | ORAL_TABLET | Freq: Four times a day (QID) | ORAL | 0 refills | Status: DC

## 2018-06-08 MED ORDER — ZOLPIDEM TARTRATE 5 MG PO TABS: 5.0000 mg | ORAL_TABLET | Freq: Every evening | ORAL | Status: DC | PRN

## 2018-06-08 MED ORDER — DIBUCAINE 1 % RE OINT: 1.0000 "application " | TOPICAL_OINTMENT | RECTAL | Status: DC | PRN

## 2018-06-08 MED ORDER — ONDANSETRON HCL 4 MG/2ML IJ SOLN: 4.0000 mg | INTRAMUSCULAR | Status: DC | PRN

## 2018-06-08 MED ORDER — DIPHENHYDRAMINE HCL 25 MG PO CAPS: 25.0000 mg | ORAL_CAPSULE | Freq: Four times a day (QID) | ORAL | Status: DC | PRN

## 2018-06-08 MED ORDER — ACETAMINOPHEN 325 MG PO TABS: 650.0000 mg | ORAL_TABLET | ORAL | Status: DC | PRN

## 2018-06-08 MED ORDER — TETANUS-DIPHTH-ACELL PERTUSSIS 5-2.5-18.5 LF-MCG/0.5 IM SUSP: 0.5000 mL | Freq: Once | INTRAMUSCULAR | Status: DC

## 2018-06-08 MED ORDER — ONDANSETRON HCL 4 MG PO TABS: 4.0000 mg | ORAL_TABLET | ORAL | Status: DC | PRN

## 2018-06-08 NOTE — Discharge Summary (Addendum)
OB Discharge Summary     Patient Name: Teresa Todd DOB: 05/12/1996 MRN: [id number]  Date of admission: 06/07/2018 Delivering MD: Candis SchatzELL, PATRICIA   Date of discharge: 06/09/2018  Admitting diagnosis: 40 WKS, CTXS Intrauterine pregnancy: 7075w5d     Secondary diagnosis:  Principal Problem:   Antepartum variable deceleration Active Problems:   Supervision of other normal pregnancy, antepartum   Short interval between pregnancies complicating pregnancy, antepartum, second trimester   Obesity affecting pregnancy in second trimester   Encounter for induction of labor      Discharge diagnosis: Term Pregnancy Delivered                                                                                                Post partum procedures:None  Augmentation: None  Complications: None  Hospital course:  Onset of Labor With Vaginal Delivery     22 y.o. yo [id number] at 1375w5d was admitted in Active Labor on 06/07/2018. Patient had an uncomplicated labor course as follows:  Membrane Rupture Time/Date: 5:17 PM ,06/07/2018   Intrapartum Procedures: Episiotomy: None [1]                                         Lacerations:  None [1]  Patient had a delivery of a Viable infant. 06/07/2018  Information for the patient's newborn:  Teresa Todd, Girl Marigene Ehlersnyllia [[id number]][[id number]]  Delivery Method: Vag-Spont    Pateint had an uncomplicated postpartum course.  She is ambulating, tolerating a regular diet, passing flatus, and urinating well. Patient is discharged home in stable condition on 06/09/18.   Physical exam  Vitals:   06/08/18 0914 06/08/18 1437 06/08/18 2118 06/09/18 0515  BP: 125/62 137/79 136/81 139/80  Pulse: 87 83 89 85  Resp: 20  18 18   Temp: 97.9 F (36.6 C) 98.1 F (36.7 C) 98.7 F (37.1 C) 97.9 F (36.6 C)  TempSrc:  Oral Oral Oral  SpO2:   99% 100%  Weight:      Height:       General: alert, cooperative  Lochia: appropriate Uterine Fundus: firm Incision: N/A DVT Evaluation: No  evidence of DVT seen on physical exam. Labs: Lab Results  Component Value Date   WBC 10.1 06/07/2018   HGB 12.2 06/07/2018   HCT 37.1 06/07/2018   MCV 90.5 06/07/2018   PLT 266 06/07/2018   No flowsheet data found.  Discharge instruction: per After Visit Summary and "Baby and Me Booklet".  After visit meds:  Allergies as of 06/09/2018   No Known Allergies     Medication List    TAKE these medications   COMFORT FIT MATERNITY SUPP LG Misc 1 Units by Does not apply route daily as needed.   ibuprofen 600 MG tablet Commonly known as:  ADVIL,MOTRIN Take 1 tablet (600 mg total) by mouth every 6 (six) hours.   Prenatal Vitamins 0.8 MG tablet Take 1 tablet by mouth daily.   senna-docusate 8.6-50 MG tablet Commonly known as:  Senokot-S Take 2 tablets by mouth  daily.       Diet: routine diet  Activity: Advance as tolerated. Pelvic rest for 6 weeks.   Outpatient follow up:4 weeks. Needs postpartum pap Follow up Appt:No future appointments. Follow up Visit:No follow-ups on file.  Postpartum contraception: Nexplanon  Newborn Data: Live born female  Birth Weight: 6 lb 15.5 oz (3161 g) APGAR: 9, 9  Newborn Delivery   Birth date/time:  06/07/2018 17:17:00 Delivery type:  Vaginal, Spontaneous     Baby Feeding: Breast Disposition:home with mother   06/09/2018 Candis Schatz, MD  I confirm that I have verified the information documented in the resident's note and that I have also personally reperformed the physical exam and all medical decision making activities.  Sharyon Cable, CNM 06/09/18

## 2018-06-08 NOTE — Clinical Social Work Maternal (Signed)
  CLINICAL SOCIAL WORK MATERNAL/CHILD NOTE  Patient Details  Name: Teresa Todd MRN: [id number] Date of Birth: 11-Jul-1996  Date:  06/08/2018  Clinical Social Worker Initiating Note:  Teresa Todd, MSW, LCSW-A Date/Time: Initiated:  06/08/18/1158     Child's Name:  Teresa Todd   Biological Parents:  Mother   Need for Interpreter:  None   Reason for Referral:  Other (Comment)(Teresa Todd and children are living at [facility])   Address:  [place] [address]    Phone number:  [phone] (home)     Additional phone number:   Household Members/Support Persons (HM/SP):       HM/SP Name Relationship DOB or Age  HM/SP -1        HM/SP -2        HM/SP -3        HM/SP -4        HM/SP -5        HM/SP -6        HM/SP -7        HM/SP -8          Natural Supports (not living in the home):  Friends, Immediate Family   Professional Supports:     Employment: Unemployed   Type of Work:     Education:  Programmer, systems   Homebound arranged:    Museum/gallery curator Resources:  Medicaid   Other Resources:  Physicist, medical , Teresa Todd Considerations Which May Impact Care:  None  Strengths:  Ability to meet basic needs , Engineer, materials, Home prepared for child    Psychotropic Medications:         Pediatrician:    [place]olic[place] area  Pediatrician List:   [place]  [place]      Pediatrician Fax Number:    Risk Factors/Current Problems:      Cognitive State:  Alert , Able to Concentrate    Mood/Affect:  Calm , Comfortable , Happy , Interested    CSW Assessment: CSW received consult for Teresa Todd due to her living at Teresa Todd met with Teresa Todd, newborn Teresa Todd, [name], and [name] at bedside to complete assessment. Teresa Todd reports that her living situation is going well, the staff  there are supportive, she is able to care for children in safe environment without feeling unstable. Teresa Todd reports that the FOB is not involved and did not wish to name him. Teresa Todd reports her biggest support is her mother Teresa Todd. Teresa Todd reports that she has a bassinet at home for [facility] for safe sleeping. Teresa Todd and CSW reviewed SIDS. Teresa Todd reports that she will utilize a cab home for transportation and she has the funds to cover the costs. Teresa Todd reports having a new car seat for safe transportation. Teresa Todd reports that Teresa Todd will receive pediatric care at Teresa Todd. Teresa Todd reports that she has a [facility] appointment scheduled for this Tuesday 06/10/18. CSW encouraged Teresa Todd to reach out for assistance if questions or needs arise, Teresa Todd agreeable. Teresa Todd did not have further questions for CSW and expressed gratitude for CSW interaction.  CSW Plan/Description:  No Further Intervention Required/No Barriers to Discharge    Teresa Todd, Teresa Todd 06/08/2018, 12:00 PM

## 2018-06-08 NOTE — Lactation Note (Signed)
This note was copied from a baby's chart. Lactation Consultation Note: Infant was transferred to NICU . Mother was given NICU brochure .  Infant had been exclusively breastfeeding prior to going to NICU.  Mother denies having any nipple tenderness.  Mother was sat up with a DEBP using #27 flanges. Mother assist with pumping and hand expression.   Breastmilk labels given to mother.  Reviewed breastmilk collection, storage and transporting breastmilk.  Mother has her own Medela PIS at home. She was informed of available NICU pumping rooms. Encouraged mother to continue to hand express before and after pumping.  Encouraged to do skin to skin with infant as early as possible.  Mother receptive to all teaching.  Mother was informed of LC services and in N[facility]. BFSG, Outpatient services and phone line 24/7 days a week for breastfeeding concerns.   Patient Name: Teresa Todd UJWJX'BToday's Date: 06/08/2018 Reason for consult: Follow-up assessment   Maternal Data    Feeding Feeding Type: Breast Fed Length of feed: 5 min  LATCH Score                   Interventions    Lactation Tools Discussed/Used Pump Review: Setup, frequency, and cleaning;Milk Storage Initiated by:: Stevan BornSherry Leshea Jaggers RN, IBCLC Date initiated:: 06/08/18   Consult Status Consult Status: Follow-up Date: 06/09/18 Follow-up type: In-patient    Stevan BornKendrick, Tiea Manninen [facility] 06/08/2018, 2:33 PM

## 2018-06-08 NOTE — Lactation Note (Signed)
This note was copied from a baby's chart. Lactation Consultation Note  Patient Name: [name] ZOXWR'UToday's Date: 06/08/2018 Reason for consult: Initial assessment;Term P2, 9 hrs female infant. Per mom, BF daughter but had difficulties w/ latching her to breast, mostly pumped for 5 months. Per mom, [facility] Mom is knowledgeable about hand expressions, compressions and breast massage, [facility]  [facility] Per mom,  She had BF infant  20 minutes before LC entered room ( 25 minutes). LC entered room,  mom hold infant in bed, but not STS w/ pacifier in  Infant's mouth.  LC discussed importance of STS to mom and baby. LC discussed pacifier may limit time at breast and cause nipple preference.  Mom encouraged to feed baby 8-12 times/24 hours and with feeding cues.  LC discussed I&O. Reviewed Baby & Me book's Breastfeeding Basics.  Mom made aware of O/P services, breastfeeding support groups, community resources, and our phone # for post-discharge questions.  Maternal Data Formula Feeding for Exclusion: No Has patient been taught Hand Expression?: Yes Does the patient have breastfeeding experience prior to this delivery?: Yes  Feeding Feeding Type: Breast Fed Length of feed: 15 min  LATCH Score                   Interventions Interventions: Hand express  Lactation Tools Discussed/Used WIC Program: Yes   Consult Status Consult Status: Follow-up Date: 06/09/18 Follow-up type: In-patient    Teresa Todd 06/08/2018, 3:07 AM

## 2018-06-09 ENCOUNTER — Encounter (HOSPITAL_COMMUNITY): Payer: Self-pay | Admitting: *Deleted

## 2018-06-09 ENCOUNTER — Other Ambulatory Visit: Payer: Medicaid Other

## 2018-06-09 NOTE — Lactation Note (Signed)
This note was copied from a baby's chart. Lactation Consultation Note  Patient Name: Teresa Todd WJXBJ'YToday's Date: 06/09/2018 Reason for consult: Follow-up assessment Baby just finished a formula feeding and she is sleeping.  Mom states she is trying to latch baby but baby shows no interest.  Discussed importance of pumping every 3 hours if the baby doesn't latch and feed well.  Mom has a DEBP at home.  Instructed to call out for assist prn prior to discharge.  Lactation outpatient services and support information reviewed and encouraged.  Maternal Data    Feeding    LATCH Score                   Interventions    Lactation Tools Discussed/Used     Consult Status Consult Status: Complete Follow-up type: Call as needed    Huston FoleyMOULDEN, Blakelee Allington S 06/09/2018, 9:42 AM

## 2018-06-09 NOTE — Discharge Instructions (Signed)
Make an appointment soon to get your Nexplanon!!!   Postpartum Care After Vaginal Delivery The period of time right after you deliver your newborn is called the postpartum period. What kind of medical care will I receive?  You may continue to receive fluids and medicines through an IV tube inserted into one of your veins.  If an incision was made near your vagina (episiotomy) or if you had some vaginal tearing during delivery, cold compresses may be placed on your episiotomy or your tear. This helps to reduce pain and swelling.  You may be given a squirt bottle to use when you go to the bathroom. You may use this until you are comfortable wiping as usual. To use the squirt bottle, follow these steps: ? Before you urinate, fill the squirt bottle with warm water. Do not use hot water. ? After you urinate, while you are sitting on the toilet, use the squirt bottle to rinse the area around your urethra and vaginal opening. This rinses away any urine and blood. ? You may do this instead of wiping. As you start healing, you may use the squirt bottle before wiping yourself. Make sure to wipe gently. ? Fill the squirt bottle with clean water every time you use the bathroom.  You will be given sanitary pads to wear. How can I expect to feel?  You may not feel the need to urinate for several hours after delivery.  You will have some soreness and pain in your abdomen and vagina.  If you are breastfeeding, you may have uterine contractions every time you breastfeed for up to several weeks postpartum. Uterine contractions help your uterus return to its normal size.  It is normal to have vaginal bleeding (lochia) after delivery. The amount and appearance of lochia is often similar to a menstrual period in the first week after delivery. It will gradually decrease over the next few weeks to a dry, yellow-brown discharge. For most women, lochia stops completely by 6-8 weeks after delivery. Vaginal  bleeding can vary from woman to woman.  Within the first few days after delivery, you may have breast engorgement. This is when your breasts feel heavy, full, and uncomfortable. Your breasts may also throb and feel hard, tightly stretched, warm, and tender. After this occurs, you may have milk leaking from your breasts.Your health care provider can help you relieve discomfort due to breast engorgement. Breast engorgement should go away within a few days.  You may feel more sad or worried than normal due to hormonal changes after delivery. These feelings should not last more than a few days. If these feelings do not go away after several days, speak with your health care provider. How should I care for myself?  Tell your health care provider if you have pain or discomfort.  Drink enough water to keep your urine clear or pale yellow.  Wash your hands thoroughly with soap and water for at least 20 seconds after changing your sanitary pads, after using the toilet, and before holding or feeding your baby.  If you are not breastfeeding, avoid touching your breasts a lot. Doing this can make your breasts produce more milk.  If you become weak or lightheaded, or you feel like you might faint, ask for help before: ? Getting out of bed. ? Showering.  Change your sanitary pads frequently. Watch for any changes in your flow, such as a sudden increase in volume, a change in color, the passing of large blood clots.  If you pass a blood clot from your vagina, save it to show to your health care provider. Do not flush blood clots down the toilet without having your health care provider look at them.  Make sure that all your vaccinations are up to date. This can help protect you and your baby from getting certain diseases. You may need to have immunizations done before you leave the hospital.  If desired, talk with your health care provider about methods of family planning or birth control  (contraception). How can I start bonding with my baby? Spending as much time as possible with your baby is very important. During this time, you and your baby can get to know each other and develop a bond. Having your baby stay with you in your room (rooming in) can give you time to get to know your baby. Rooming in can also help you become comfortable caring for your baby. Breastfeeding can also help you bond with your baby. How can I plan for returning home with my baby?  Make sure that you have a car seat installed in your vehicle. ? Your car seat should be checked by a certified car seat installer to make sure that it is installed safely. ? Make sure that your baby fits into the car seat safely.  Ask your health care provider any questions you have about caring for yourself or your baby. Make sure that you are able to contact your health care provider with any questions after leaving the hospital. This information is not intended to replace advice given to you by your health care provider. Make sure you discuss any questions you have with your health care provider. Document Released: 08/19/2007 Document Revised: 03/26/2016 Document Reviewed: 09/26/2015 Elsevier Interactive Patient Education  [employer]2018 [employer]

## 2018-06-10 ENCOUNTER — Inpatient Hospital Stay (HOSPITAL_COMMUNITY): Admission: RE | Admit: 2018-06-10 | Payer: Medicaid Other | Source: Ambulatory Visit

## 2018-06-17 ENCOUNTER — Other Ambulatory Visit: Payer: Self-pay | Admitting: Family Medicine

## 2018-06-17 MED ORDER — AMLODIPINE BESYLATE 5 MG PO TABS: 5.0000 mg | ORAL_TABLET | Freq: Every day | ORAL | 3 refills | Status: DC

## 2018-07-15 ENCOUNTER — Ambulatory Visit: Payer: Medicaid Other | Admitting: Student

## 2019-06-13 IMAGING — US US MFM OB DETAIL+14 WK
1 of 2 series · 13 of 28 positions shown · non-contrast
Comparison: none

[Series 1: us mfm ob detail+14 wk · 56 acquisitions, 13 frames shown]
[im 3/56]
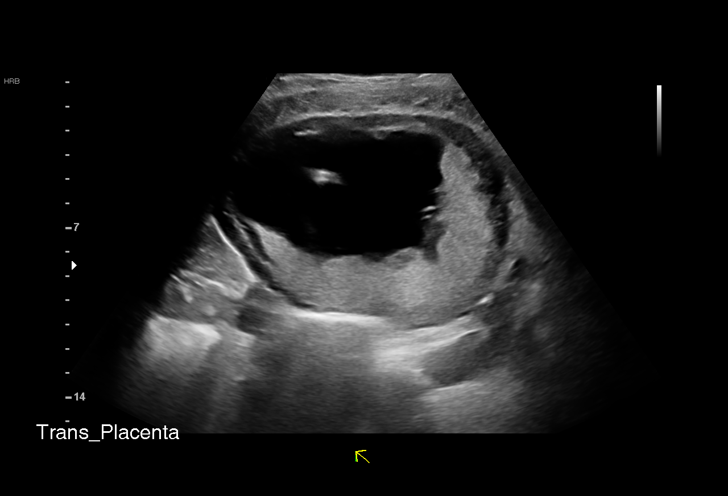
[im 7/56]
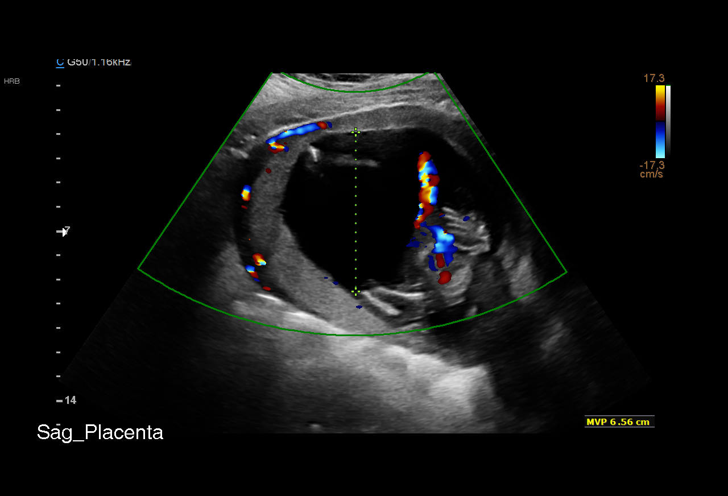
[im 11/56]
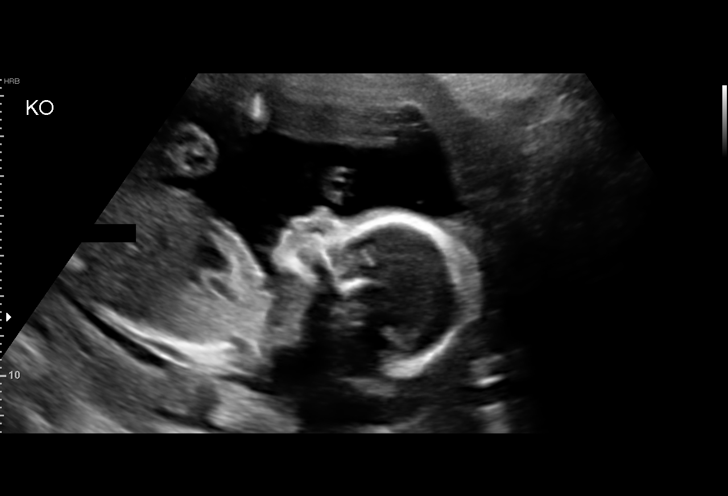
[im 15/56]
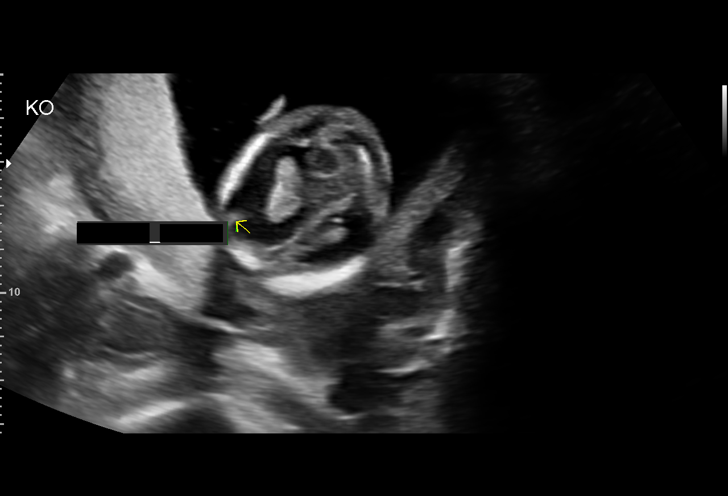
[im 20/56]
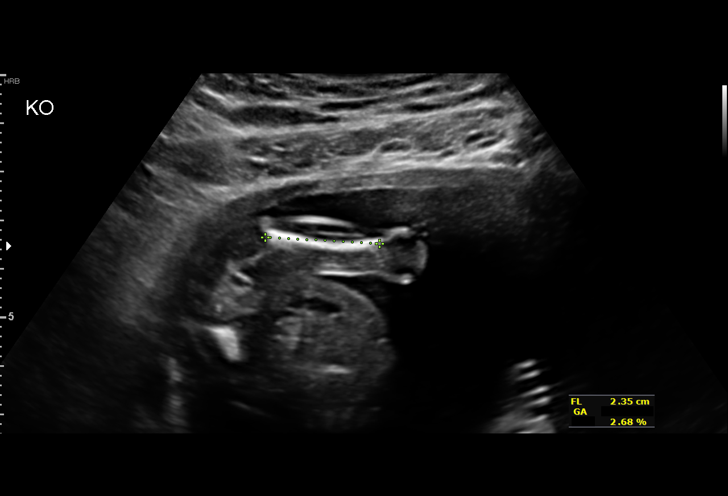
[im 24/56]
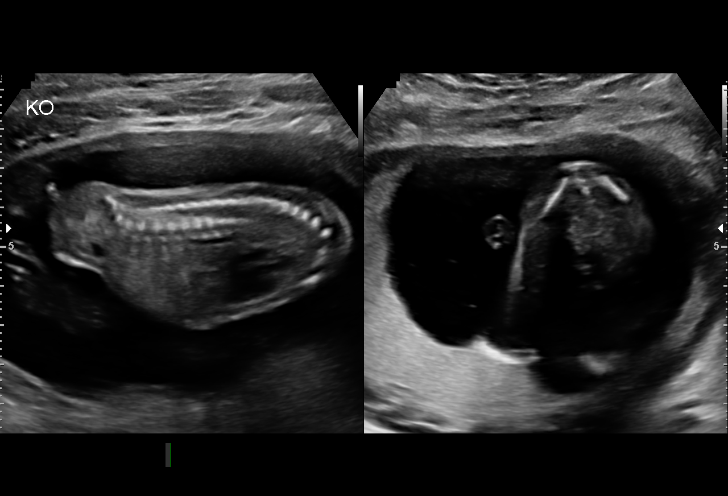
[im 30/56]
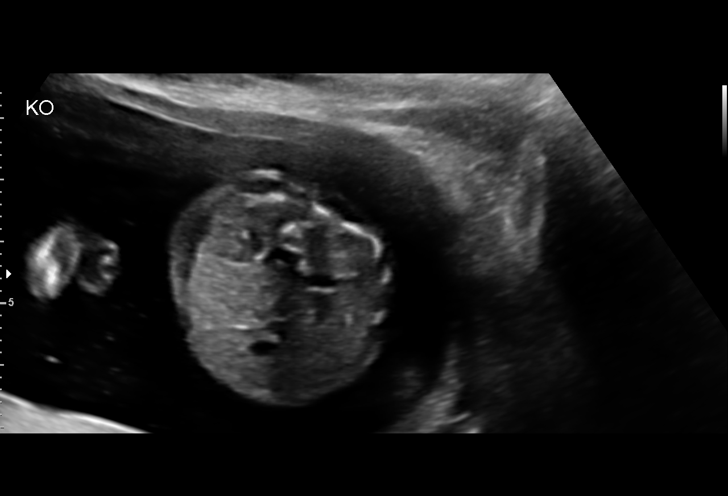
[im 34/56]
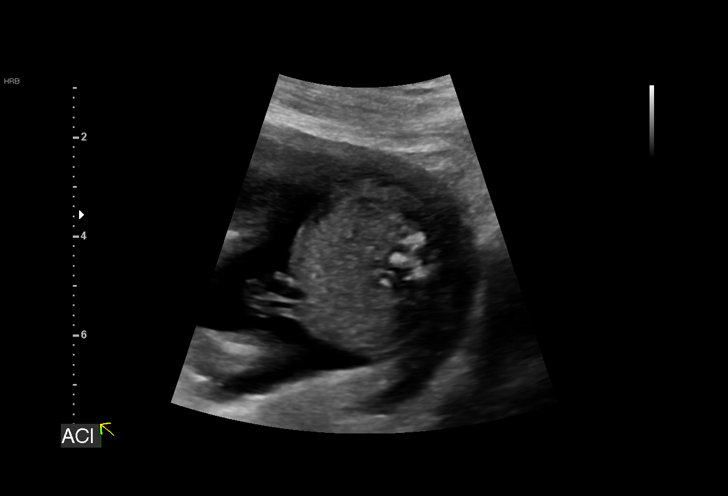
[im 39/56]
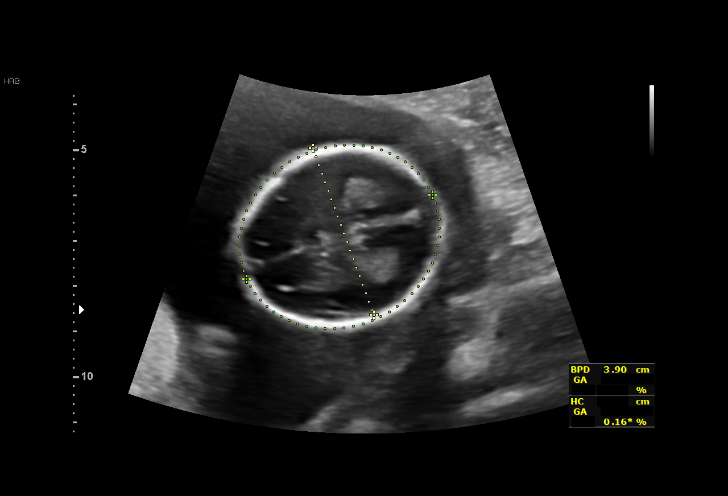
[im 43/56]
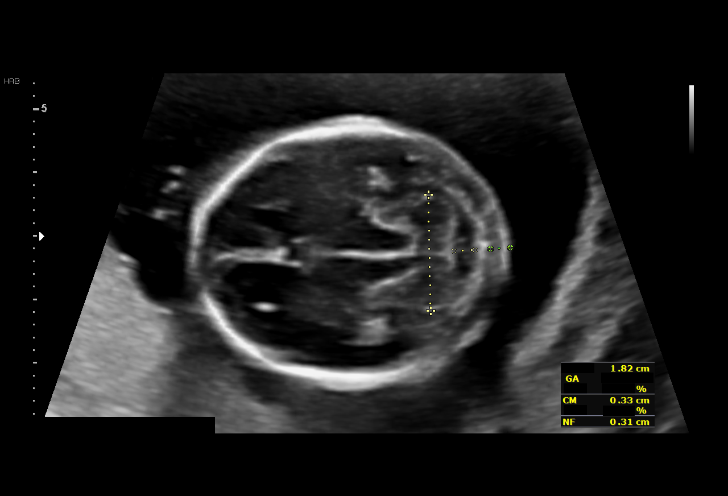
[im 47/56]
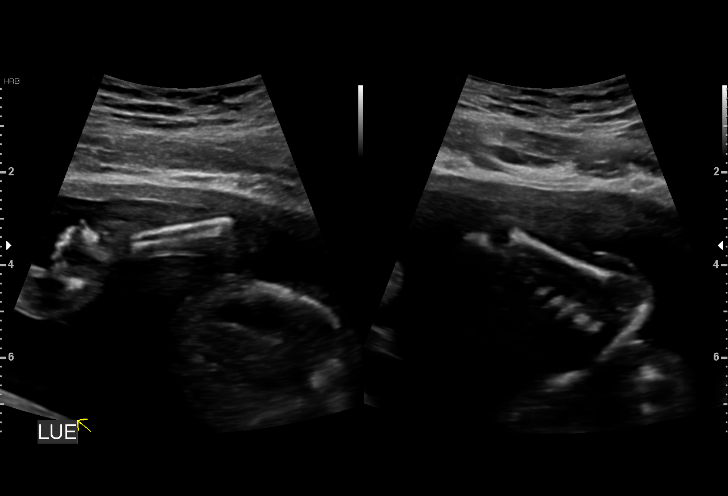
[im 51/56]
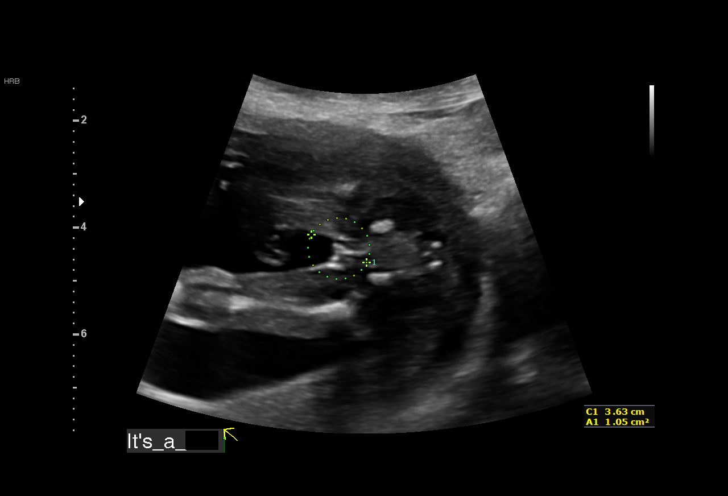
[im 56/56]
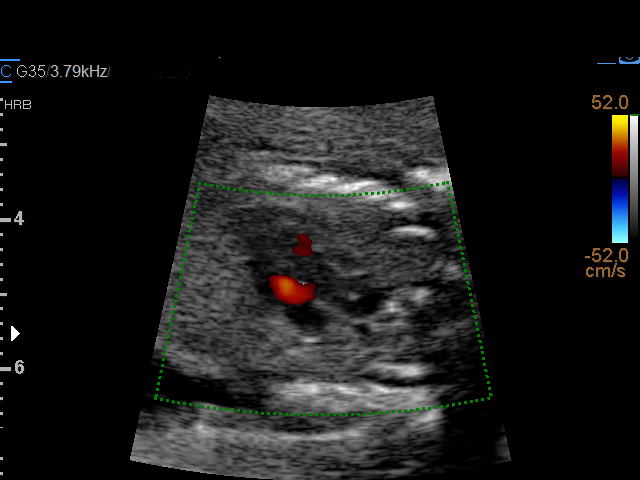

[13 of 28 positions shown; findings below may reference images not displayed]

[REDACTED]. [HOSPITAL],

1  FABY SURBER            [id number]      [id number]     [id number]
Indications

17 weeks gestation of pregnancy
Obesity complicating pregnancy, second
trimester
Short interval between pregancies, 2nd
trimester
OB History

Blood Type:            Height:  5'4"   Weight (lb):  218       BMI:
Gravidity:    4         Term:   1         SAB:   1
TOP:          1        Living:  1
Fetal Evaluation

Num Of Fetuses:     1
Fetal Heart         146
Rate(bpm):
Cardiac Activity:   Observed
Presentation:       Transverse, head to maternal left
Placenta:           Posterior, above cervical os
P. Cord Insertion:  Visualized, central

Amniotic Fluid
AFI FV:      Subjectively within normal limits

Largest Pocket(cm)
6.56
Biometry

BPD:      38.9  mm     G. Age:  17w 6d         56  %    CI:        81.18   %    70 - 86
FL/HC:      17.6   %    15.8 - 18
HC:      136.3  mm     G. Age:  17w 1d         14  %    HC/AC:      1.04        1.07 -
AC:      131.1  mm     G. Age:  18w 4d         77  %    FL/BPD:     61.7   %
FL:         24  mm     G. Age:  17w 1d         27  %    FL/AC:      18.3   %    20 - 24
HUM:      23.3  mm     G. Age:  17w 1d         41  %
CER:      18.2  mm     G. Age:  18w 1d         58  %
NFT:       3.1  mm

CM:        3.3  mm

Est. FW:     214  gm      0 lb 8 oz     53  %
Gestational Age

LMP:           18w 6d        Date:  08/27/17                 EDD:   06/03/18
U/S Today:     17w 5d                                        EDD:   06/11/18
Best:          17w 5d     Det. By:  U/S (01/06/18)           EDD:   06/11/18
Anatomy

Cranium:               Appears normal         Aortic Arch:            Not well visualized
Cavum:                 Appears normal         Ductal Arch:            Not well visualized
Ventricles:            Appears normal         Diaphragm:              Appears normal
Choroid Plexus:        Appears normal         Stomach:                Appears normal, left
sided
Cerebellum:            Appears normal         Abdomen:                Appears normal
Posterior Fossa:       Appears normal         Abdominal Wall:         Appears nml (cord
insert, abd wall)
Nuchal Fold:           Appears normal         Cord Vessels:           Appears normal (3
vessel cord)
Face:                  Orbits nl; profile not Kidneys:                Appear normal
well visualized
Lips:                  Appears normal         Bladder:                Appears normal
Thoracic:              Appears normal         Spine:                  Ltd views no
intracranial signs of
NT
Heart:                 Echogenic focus        Upper Extremities:      Appears normal
in LV
RVOT:                  Not well visualized    Lower Extremities:      Appears normal
LVOT:                  Vis. Color Doppler

Other:  Female gender. Technically difficult due to early gestational age and
fetal position.
Cervix Uterus Adnexa

Cervix
Length:           2.67  cm.
Normal appearance by transabdominal scan.

Uterus
No abnormality visualized.

Cul De Sac:   No free fluid seen.

Adnexa:       No adnexal mass visualized.
Comments

This is a remote read.
Impression

Single living intrauterine pregnancy at 17w 5d (remote read
only)
Placenta Posterior, above cervical os.
Appropriate fetal growth.
Normal amniotic fluid volume.
The fetal anatomic survey is not complete.
No gross fetal anomalies identified.
limited views as documented above owing to position and
early gestational age
EIF noted (see below)
The cervix measures 2.67cm transabdominally without
funneling.
The adnexa appear normal bilaterally without masses.

An echogenic focus was seen in the left cardiac ventricle.
However, no gross fetal anomalies or other soft markers of
aneuploidy were identified.   An echogenic intracardiac focus
is felt to represent a calcified papillary muscle, and is not
associated with structural or functional cardiac abnormalities.
Although an echogenic cardiac focus may be associated with
an increased risk of Down syndrome, this risk is felt to be
minimal, especially when it is seen as an isolated finding in a
patient with low a priori risk.
Recommendations

Interval growth and attempt to complete survey in 4 weeks.
Consider cervical length at that time to assess for risk of
SPTB at 
22 weeks gestational age in setting of short interval
pregnancy.

## 2019-07-08 ENCOUNTER — Telehealth: Payer: Self-pay | Admitting: Obstetrics and Gynecology

## 2019-07-08 NOTE — Telephone Encounter (Signed)
Attempted to call patient about her appointment on 9/3 @ 9:35. No answer left voicemail instructing patient to wear a face mask for the entire appointment and no visitors are allowed during the visit. Patient instructed not to attend the appointment if she was any symptoms. Symptom list and office number left.

## 2019-07-09 ENCOUNTER — Ambulatory Visit: Payer: Medicaid Other | Admitting: Obstetrics and Gynecology

## 2019-10-05 IMAGING — US US MFM OB FOLLOW-UP
1 series · 14 of 28 positions shown · non-contrast
Comparison: none

[Series 1: us mfm ob follow-up · 57 acquisitions, 14 frames shown]
[im 3/57]
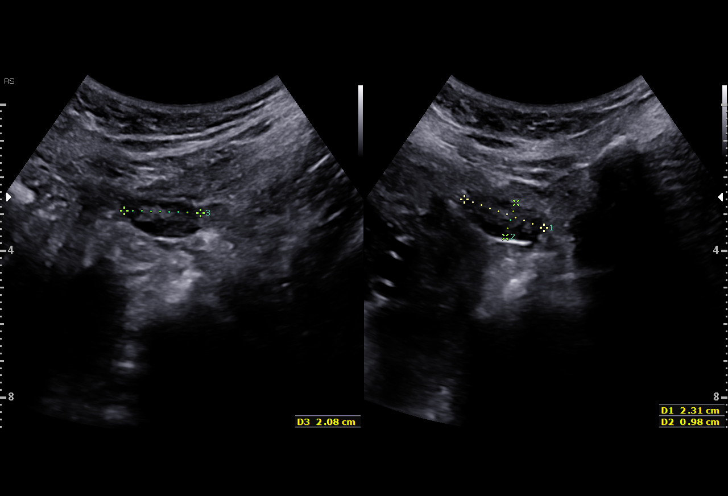
[im 7/57]
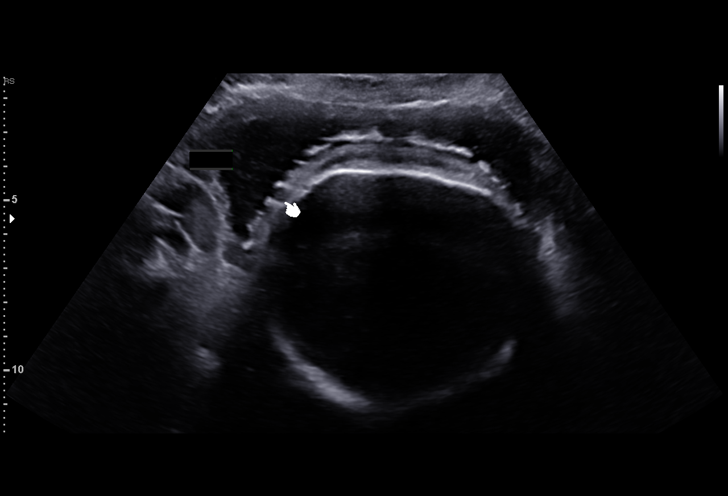
[im 11/57]
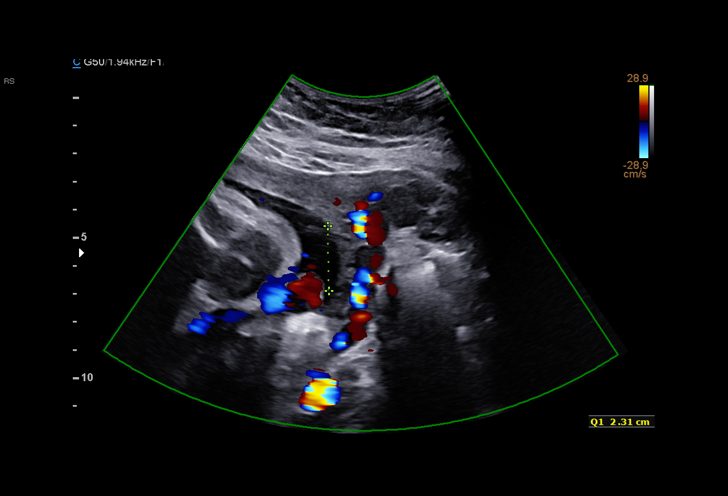
[im 15/57]
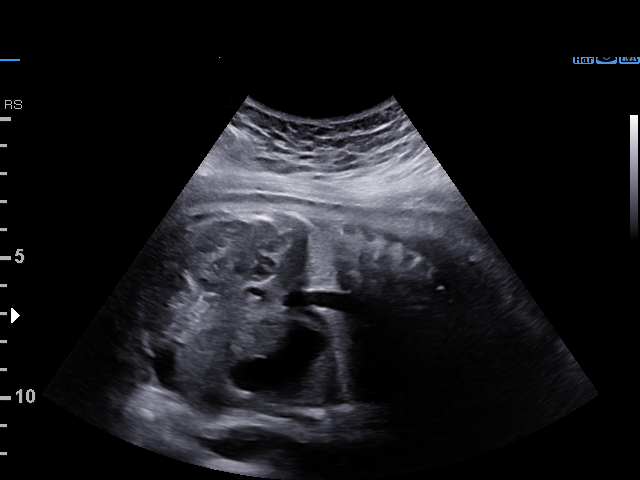
[im 19/57]
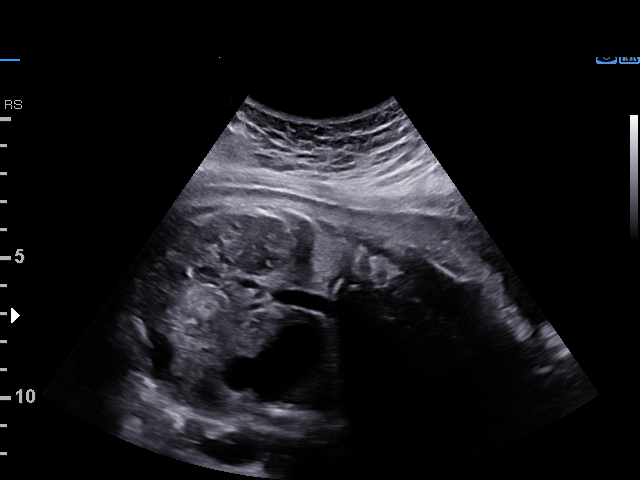
[im 23/57]
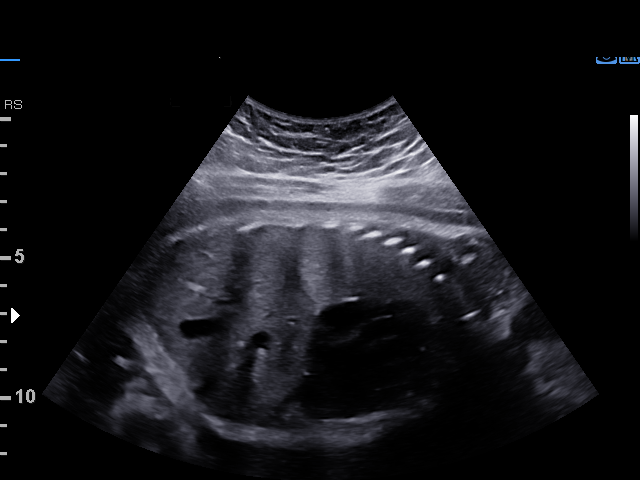
[im 27/57]
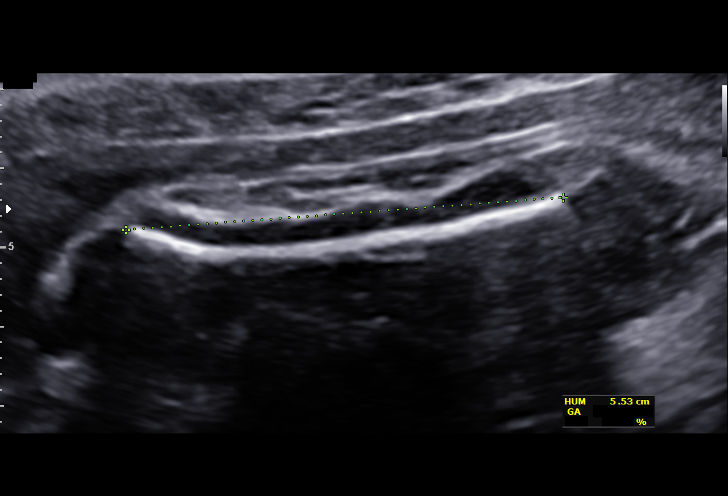
[im 32/57]
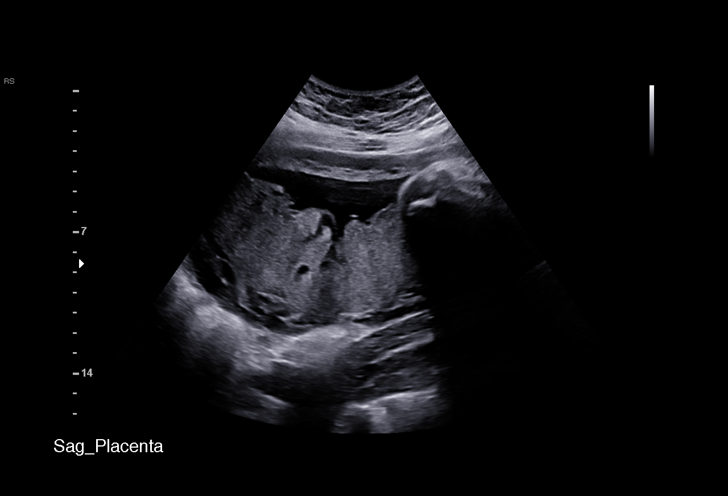
[im 36/57]
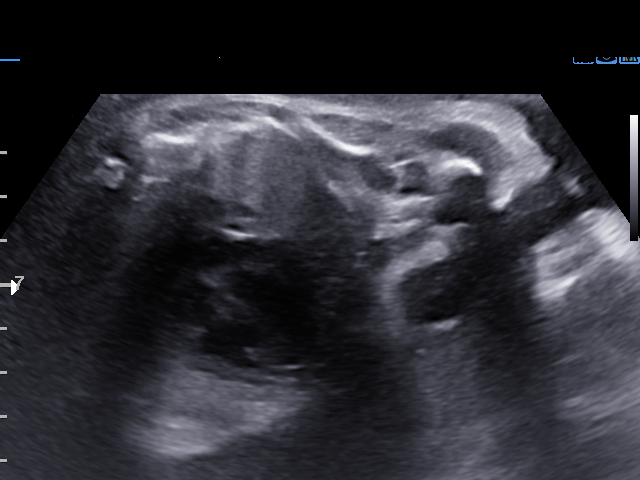
[im 40/57]
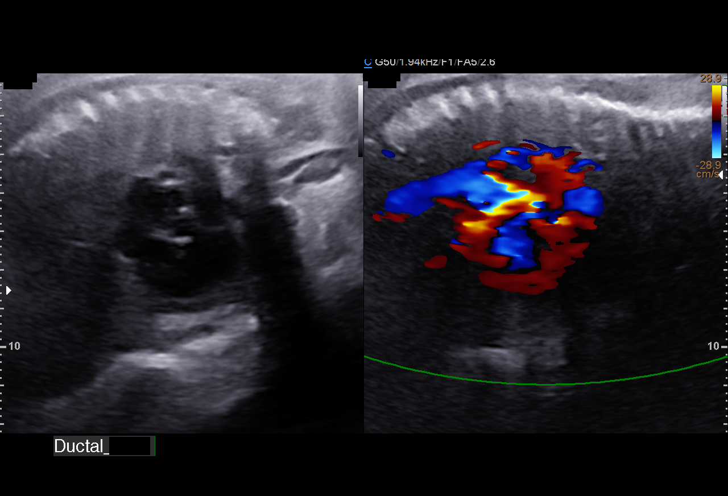
[im 44/57]
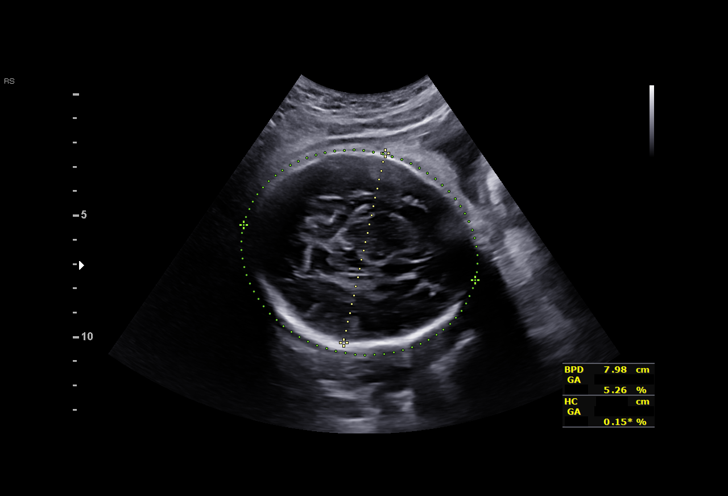
[im 48/57]
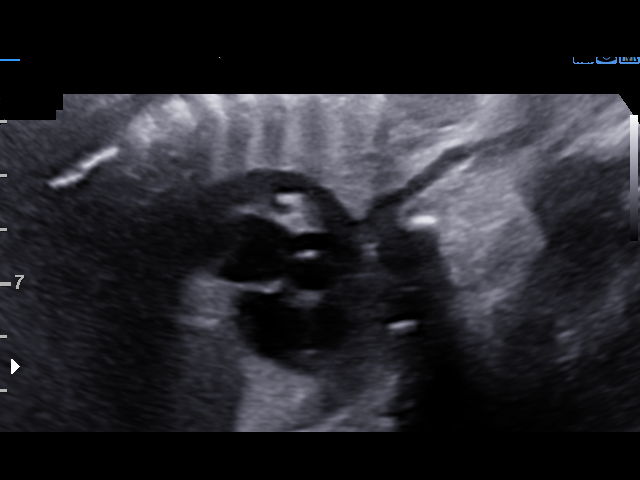
[im 52/57]
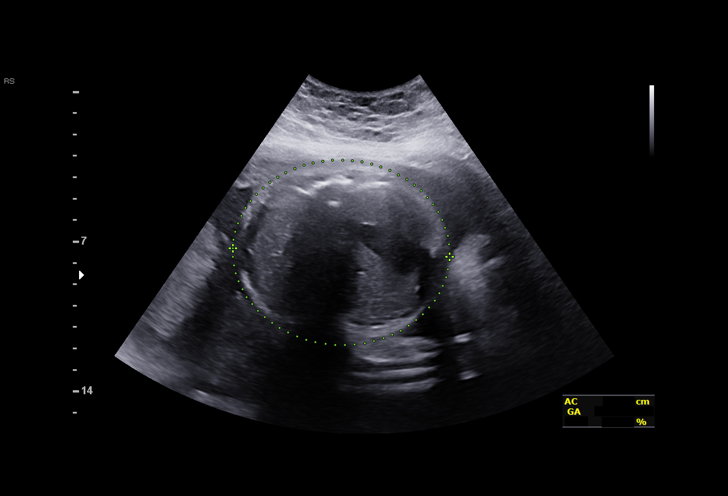
[im 57/57]
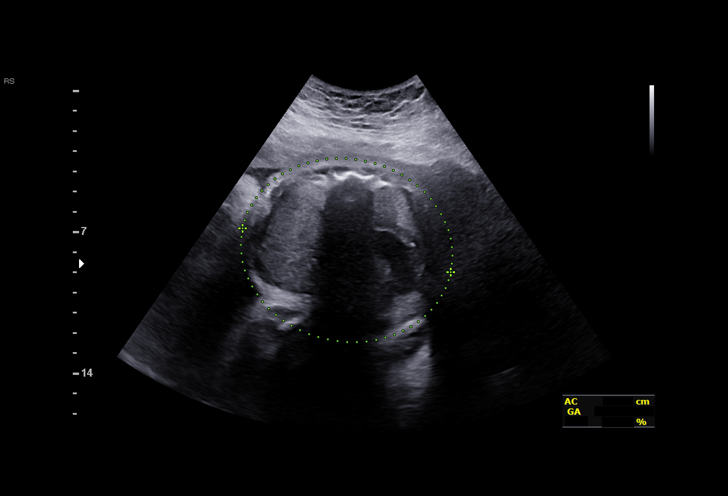

[14 of 28 positions shown; findings below may reference images not displayed]

am)

[REDACTED]. [HOSPITAL],

1  ARMIDA MAYEN            [id number]      [id number]     [id number]
Indications

34 weeks gestation of pregnancy
Short interval between pregancies, 3rd
trimester
Obesity complicating pregnancy, third
trimester
Antenatal follow-up for nonvisualized fetal
anatomy
OB History

Blood Type:            Height:  5'4"   Weight (lb):  218       BMI:
Gravidity:    4         Term:   1         SAB:   1
TOP:          1        Living:  1
Fetal Evaluation

Num Of Fetuses:     1
Fetal Heart         129
Rate(bpm):
Cardiac Activity:   Observed
Presentation:       Cephalic
Placenta:           Posterior, above cervical os

Amniotic Fluid
AFI FV:      Subjectively within normal limits

AFI Sum(cm)     %Tile       Largest Pocket(cm)
14.42           51

RUQ(cm)       RLQ(cm)       LUQ(cm)        LLQ(cm)
2.31
Biometry

BPD:      80.5  mm     G. Age:  32w 2d          8  %    CI:        78.44   %    70 - 86
FL/HC:      21.5   %    19.4 -
HC:      287.5  mm     G. Age:  31w 4d        < 3  %    HC/AC:      0.96        0.96 -
AC:      299.1  mm     G. Age:  33w 6d         50  %    FL/BPD:     76.6   %    71 - 87
FL:       61.7  mm     G. Age:  32w 0d          5  %    FL/AC:      20.6   %    20 - 24
HUM:      55.2  mm     G. Age:  32w 1d         19  %

Est. FW:    1812  gm    4 lb 10 oz      39  %
Gestational Age

LMP:           35w 1d        Date:  08/27/17                 EDD:   06/03/18
U/S Today:     32w 3d                                        EDD:   06/22/18
Best:          34w 0d     Det. By:  U/S  (01/06/18)          EDD:   06/11/18
Anatomy

Cranium:               Previously seen        Aortic Arch:            Appears normal
Cavum:                 Previously seen        Ductal Arch:            Appears normal
Ventricles:            Previously seen        Diaphragm:              Previously seen
Choroid Plexus:        Previously seen        Stomach:                Appears normal, left
sided
Cerebellum:            Previously seen        Abdomen:                Appears normal
Posterior Fossa:       Previously seen        Abdominal Wall:         Previously seen
Nuchal Fold:           Not applicable (>20    Cord Vessels:           Previously seen
wks GA)
Face:                  Orbits previously      Kidneys:                Appear normal
seen
Lips:                  Previously seen        Bladder:                Appears normal
Thoracic:              Appears normal         Spine:                  Limited views
previously seen
Heart:                 Not well visualized    Upper Extremities:      Previously seen
RVOT:                  Not well visualized    Lower Extremities:      Previously seen
LVOT:                  Appears normal

Other:  Female gender previously visualized. Technically difficult due to fetal
position.
Cervix Uterus Adnexa

Cervix
Not visualized (advanced GA >38wks)

Uterus
No abnormality visualized.

Left Ovary
Within normal limits.

Right Ovary
Not visualized.

Adnexa:       No adnexal mass visualized. No abnormality
visualized.
Impression

Single living intrauterine pregnancy at 34w 0d.
Cephalic presentation.
Placenta Posterior, above cervical os.
Normal amniotic fluid volume.
Appropriate interval fetal growth.
Normal interval fetal anatomy.
Recommendations

If patient remains undelivered, recommend interval growth
assessment in 4-6 weeks due to obesity and risks of
abnormal fetal growth pattern.

## 2019-11-06 NOTE — L&D Delivery Note (Addendum)
Patient: Teresa Todd MRN: [id number]  GBS status: Negative   Patient is a 24 y.o. now [id number] s/p NSVD at [redacted]w[redacted]d, who was admitted for SOL and elevated BP. SROM 0h 31m prior to delivery with clear fluid.   Initial SVE upon admission at 0200 was 3.5/80/-3 with contractions every 5 minutes. She was augmented with Pitocin and quickly progressed to 10/100/+2 at 0346.  Delivery Note After progressing to complete, patient began pushing. Head delivered LOA. Membranes ruptured spontaneously with delivery of head. No nuchal cord present. Shoulder and body delivered in usual fashion. Infant with spontaneous cry, placed on mother's abdomen, dried and bulb suctioned. Cord clamped x 2 after 1-minute delay, and cut by family member. Cord blood drawn. Placenta delivered spontaneously with gentle cord traction. Fundus firm with massage and Pitocin. Perineum inspected and found to be intact.  At 3:50 AM a viable female was delivered via Vaginal, Spontaneous (Presentation: Left Occiput Anterior).  APGAR: 9, 9; weight per medical record.   Placenta status: Spontaneous, Intact.  Cord: 3 vessels with the following complications: None.  Anesthesia: None Episiotomy: None Lacerations: None Est. Blood Loss (mL): 105  Mom to postpartum.  Baby to Couplet care / Skin to Skin.  Worthy Rancher, MD Family Medicine PGY-3 05/25/2020, 4:03 AM  OB FELLOW DELIVERY ATTESTATION  I was gloved and present for the delivery in its entirety, and I agree with the above resident's note.    Jerilynn Birkenhead, MD [facility] Family Medicine Fellow, [facility] for [employer], [facility]

## 2020-02-01 ENCOUNTER — Other Ambulatory Visit: Payer: Self-pay

## 2020-02-01 ENCOUNTER — Emergency Department (HOSPITAL_COMMUNITY)
Admission: EM | Admit: 2020-02-01 | Discharge: 2020-02-01 | Disposition: A | Discharge status: Home / Self Care | Payer: Medicaid Other | Attending: Emergency Medicine | Admitting: Emergency Medicine

## 2020-02-01 ENCOUNTER — Encounter (HOSPITAL_COMMUNITY): Payer: Self-pay | Admitting: *Deleted

## 2020-02-01 DIAGNOSIS — Z20822 Contact with and (suspected) exposure to covid-19: Secondary | ICD-10-CM | POA: Insufficient documentation

## 2020-02-01 DIAGNOSIS — R197 Diarrhea, unspecified: Secondary | ICD-10-CM | POA: Insufficient documentation

## 2020-02-01 DIAGNOSIS — R112 Nausea with vomiting, unspecified: Secondary | ICD-10-CM | POA: Insufficient documentation

## 2020-02-01 DIAGNOSIS — O2691 Pregnancy related conditions, unspecified, first trimester: Secondary | ICD-10-CM | POA: Diagnosis not present

## 2020-02-01 DIAGNOSIS — Z3490 Encounter for supervision of normal pregnancy, unspecified, unspecified trimester: Secondary | ICD-10-CM

## 2020-02-01 DIAGNOSIS — Z79899 Other long term (current) drug therapy: Secondary | ICD-10-CM | POA: Diagnosis not present

## 2020-02-01 LAB — CBC
HCT: 31.6 % — ABNORMAL LOW (ref 36.0–46.0)
Hemoglobin: 9.6 g/dL — ABNORMAL LOW (ref 12.0–15.0)
MCH: 25.3 pg — ABNORMAL LOW (ref 26.0–34.0)
MCHC: 30.4 g/dL (ref 30.0–36.0)
MCV: 83.4 fL (ref 80.0–100.0)
Platelets: 345 10*3/uL (ref 150–400)
RBC: 3.79 MIL/uL — ABNORMAL LOW (ref 3.87–5.11)
RDW: 15.5 % (ref 11.5–15.5)
WBC: 10.3 10*3/uL (ref 4.0–10.5)
nRBC: 0 % (ref 0.0–0.2)

## 2020-02-01 LAB — SARS CORONAVIRUS 2 (TAT 6-24 HRS): SARS Coronavirus 2: NEGATIVE

## 2020-02-01 LAB — URINALYSIS, ROUTINE W REFLEX MICROSCOPIC
Bilirubin Urine: NEGATIVE
Glucose, UA: NEGATIVE mg/dL
Hgb urine dipstick: NEGATIVE
Ketones, ur: 80 mg/dL — AB
Leukocytes,Ua: NEGATIVE
Nitrite: NEGATIVE
Protein, ur: 100 mg/dL — AB
Specific Gravity, Urine: 1.031 — ABNORMAL HIGH (ref 1.005–1.030)
pH: 5 (ref 5.0–8.0)

## 2020-02-01 LAB — COMPREHENSIVE METABOLIC PANEL
ALT: 13 U/L (ref 0–44)
AST: 21 U/L (ref 15–41)
Albumin: 2.9 g/dL — ABNORMAL LOW (ref 3.5–5.0)
Alkaline Phosphatase: 66 U/L (ref 38–126)
Anion gap: 11 (ref 5–15)
BUN: 9 mg/dL (ref 6–20)
CO2: 19 mmol/L — ABNORMAL LOW (ref 22–32)
Calcium: 8.5 mg/dL — ABNORMAL LOW (ref 8.9–10.3)
Chloride: 105 mmol/L (ref 98–111)
Creatinine, Ser: 0.62 mg/dL (ref 0.44–1.00)
GFR calc Af Amer: >60 mL/min (ref >60)
GFR calc non Af Amer: >60 mL/min (ref >60)
Glucose, Bld: 97 mg/dL (ref 70–99)
Potassium: 3.7 mmol/L (ref 3.5–5.1)
Sodium: 135 mmol/L (ref 135–145)
Total Bilirubin: 0.6 mg/dL (ref 0.3–1.2)
Total Protein: 7.3 g/dL (ref 6.5–8.1)

## 2020-02-01 LAB — HCG, QUANTITATIVE, PREGNANCY: hCG, Beta Chain, Quant, S: 5590 m[IU]/mL — ABNORMAL HIGH (ref <5)

## 2020-02-01 LAB — LIPASE, BLOOD: Lipase: 25 U/L (ref 11–51)

## 2020-02-01 LAB — I-STAT BETA HCG BLOOD, ED (MC, WL, AP ONLY): I-stat hCG, quantitative: 1922.3 m[IU]/mL — ABNORMAL HIGH (ref <5)

## 2020-02-01 MED ORDER — METOCLOPRAMIDE HCL 10 MG PO TABS: 10.0000 mg | ORAL_TABLET | Freq: Four times a day (QID) | ORAL | 0 refills | Status: DC

## 2020-02-01 MED ORDER — METOCLOPRAMIDE HCL 5 MG/ML IJ SOLN
10.0000 mg | Freq: Once | INTRAMUSCULAR | Status: AC
  Administered 2020-02-01: 10 mg via INTRAVENOUS
  Filled 2020-02-01: qty 2

## 2020-02-01 MED ORDER — SODIUM CHLORIDE 0.9% FLUSH
3.0000 mL | Freq: Once | INTRAVENOUS | Status: AC
  Administered 2020-02-01: 3 mL via INTRAVENOUS

## 2020-02-01 MED ORDER — SODIUM CHLORIDE 0.9 % IV BOLUS
1000.0000 mL | Freq: Once | INTRAVENOUS | Status: AC
  Administered 2020-02-01: 10:00:00 1000 mL via INTRAVENOUS

## 2020-02-01 NOTE — ED Triage Notes (Signed)
Pt reports recent +preg test. Woke up 0100 with severe n/v/d and now feeling weak, fatigued.

## 2020-02-01 NOTE — ED Provider Notes (Signed)
[facility] EMERGENCY DEPARTMENT Provider Note   CSN: [id number] Arrival date & time: 02/01/20  6789     History Chief Complaint  Patient presents with  . Emesis  . Diarrhea    Teresa Todd is a 24 y.o. female with no significant past medical history who presents to the ED due to sudden onset of nausea, vomiting, diarrhea that started around 1 AM this morning.  Patient admits to 12-15 episodes of nonbilious emesis with streaks of blood and roughly 12 episodes of nonbloody diarrhea.  Denies associated abdominal pain and fever.  Admits to intermittent chills.  Patient notes she had a positive pregnancy test back in February, but has not seen an OB/GYN for confirmation yet.  Denies vaginal bleeding and vaginal discharge.  Unsure when her last menstrual cycle was given that is typically irregular.  Denies recent antibiotic use and ingestion of undercooked foods.  Denies sick contacts and Covid exposures.  Denies cough, sore throat, and shortness of breath.  Patient notes after numerous episodes of emesis and diarrhea she admits to generalized weakness and fatigue.  Denies previous abdominal operations.  Admits to marijuana use however not since positive pregnancy test.  History obtained from patient and past medical records. No interpreter used during encounter.      Past Medical History:  Diagnosis Date  . Medical history non-contributory     Patient Active Problem List   Diagnosis Date Noted  . Antepartum variable deceleration 06/07/2018  . Encounter for induction of labor 06/07/2018  . Obesity affecting pregnancy in second trimester   . Short interval between pregnancies complicating pregnancy, antepartum, second trimester 12/09/2017  . Supervision of other normal pregnancy, antepartum 12/03/2017    Past Surgical History:  Procedure Laterality Date  . DILATION AND CURETTAGE OF UTERUS       OB History    Gravida  5   Para  1   Term  1   Preterm    0   AB  2   Living  1     SAB  1   TAB  1   Ectopic  0   Multiple  0   Live Births  1           Family History  Problem Relation Age of Onset  . Hypertension Mother     Social History   Tobacco Use  . Smoking status: Former Research scientist (life sciences)  . Smokeless tobacco: Never Used  Substance Use Topics  . Alcohol use: No  . Drug use: No    Home Medications Prior to Admission medications   Medication Sig Start Date End Date Taking? Authorizing Provider  amLODipine (NORVASC) 5 MG tablet Take 1 tablet (5 mg total) by mouth daily. 06/17/18   Truett Mainland, DO  Elastic Bandages & Supports (COMFORT FIT MATERNITY SUPP LG) MISC 1 Units by Does not apply route daily as needed. Patient not taking: Reported on 05/28/2018 05/21/18   Luvenia Redden, PA-C  ibuprofen (ADVIL,MOTRIN) 600 MG tablet Take 1 tablet (600 mg total) by mouth every 6 (six) hours. 06/08/18   Nicolette Bang, DO  metoCLOPramide (REGLAN) 10 MG tablet Take 1 tablet (10 mg total) by mouth every 6 (six) hours. 02/01/20   Suzy Bouchard, PA-C  Prenatal Multivit-Min-Fe-FA (PRENATAL VITAMINS) 0.8 MG tablet Take 1 tablet by mouth daily. 12/09/17   Marcille Buffy D, CNM  senna-docusate (SENOKOT-S) 8.6-50 MG tablet Take 2 tablets by mouth daily. 06/08/18   Nicolette Bang,  DO    Allergies    Patient has no known allergies.  Review of Systems   Review of Systems  Constitutional: Positive for chills and fatigue. Negative for fever.  Respiratory: Negative for cough and shortness of breath.   Cardiovascular: Negative for chest pain.  Gastrointestinal: Positive for diarrhea, nausea and vomiting. Negative for abdominal pain.  Genitourinary: Negative for dysuria.  Musculoskeletal: Negative for back pain.  Neurological: Positive for weakness (generalized). Negative for headaches.  All other systems reviewed and are negative.   Physical Exam Updated Vital Signs BP 128/79 (BP Location: Right Arm)   Pulse 95    Temp 98 F (36.7 C) (Oral)   Resp 16   Ht 5\' 3"  (1.6 m)   Wt 97.5 kg   LMP  (LMP Unknown)   SpO2 99%   BMI 38.09 kg/m   Physical Exam Vitals and nursing note reviewed.  Constitutional:      General: She is not in acute distress.    Appearance: She is not toxic-appearing.  HENT:     Head: Normocephalic.  Eyes:     Pupils: Pupils are equal, round, and reactive to light.  Cardiovascular:     Rate and Rhythm: Normal rate and regular rhythm.     Pulses: Normal pulses.     Heart sounds: Normal heart sounds. No murmur. No friction rub. No gallop.   Pulmonary:     Effort: Pulmonary effort is normal.     Breath sounds: Normal breath sounds.  Abdominal:     General: Abdomen is flat. Bowel sounds are normal. There is no distension.     Palpations: Abdomen is soft.     Tenderness: There is no abdominal tenderness. There is no right CVA tenderness, left CVA tenderness, guarding or rebound.     Comments: Abdomen soft, nondistended, nontender to palpation in all quadrants without guarding or peritoneal signs. No rebound.     Musculoskeletal:     Cervical back: Neck supple.     Comments: Able to move all 4 extremities without difficulty.  Skin:    General: Skin is warm and dry.  Neurological:     General: No focal deficit present.     Mental Status: She is alert.  Psychiatric:        Mood and Affect: Mood normal.        Behavior: Behavior normal.     ED Results / Procedures / Treatments   Labs (all labs ordered are listed, but only abnormal results are displayed) Labs Reviewed  COMPREHENSIVE METABOLIC PANEL - Abnormal; Notable for the following components:      Result Value   CO2 19 (*)    Calcium 8.5 (*)    Albumin 2.9 (*)    All other components within normal limits  CBC - Abnormal; Notable for the following components:   RBC 3.79 (*)    Hemoglobin 9.6 (*)    HCT 31.6 (*)    MCH 25.3 (*)    All other components within normal limits  URINALYSIS, ROUTINE W REFLEX  MICROSCOPIC - Abnormal; Notable for the following components:   APPearance HAZY (*)    Specific Gravity, Urine 1.031 (*)    Ketones, ur 80 (*)    Protein, ur 100 (*)    Bacteria, UA RARE (*)    All other components within normal limits  HCG, QUANTITATIVE, PREGNANCY - Abnormal; Notable for the following components:   hCG, Beta Chain, Quant, S 5,590 (*)    All other  components within normal limits  I-STAT BETA HCG BLOOD, ED ([facility], [facility], [facility] ONLY) - Abnormal; Notable for the following components:   I-stat hCG, quantitative 1,922.3 (*)    All other components within normal limits  SARS CORONAVIRUS 2 (TAT 6-24 HRS)  LIPASE, BLOOD    EKG None  Radiology No results found.  Procedures Procedures (including critical care time)  Medications Ordered in ED Medications  sodium chloride flush (NS) 0.9 % injection 3 mL (3 mLs Intravenous Given 02/01/20 0949)  sodium chloride 0.9 % bolus 1,000 mL (0 mLs Intravenous Stopped 02/01/20 1043)  metoCLOPramide (REGLAN) injection 10 mg (10 mg Intravenous Given 02/01/20 0947)    ED Course  I have reviewed the triage vital signs and the nursing notes.  Pertinent labs & imaging results that were available during my care of the patient were reviewed by me and considered in my medical decision making (see chart for details).  Clinical Course as of Feb 01 1304  Mon Feb 01, 2020  1010 I-stat hCG, quantitative(!): 1,922.3 [CA]  1255 HCG, Beta Chain, Quant, S(!): 5,590 [CA]    Clinical Course User Index [CA] Mannie Stabile, PA-C   MDM Rules/Calculators/A&P                     24 year old female presents to the ED due to sudden onset of nausea, vomiting, diarrhea that started around 1 AM this morning.  Admits to a positive pregnancy test at the end of February.  Unsure when last menstrual cycle was.  Denies sick contacts and Covid exposures.  Stable vitals.  Patient no acute distress and nontoxic-appearing.  Physical exam reassuring. Abdomen soft,  non-distended and non-tender. No concern for acute abdomen at this time.  Suspect symptoms related to gastroenteritis.  Will obtain routine labs.  Also start IV fluids and IV Phenergan.  Lipase normal at 25.  Doubt pancreatitis.  CBC reassuring with no leukocytosis.  Hemoglobin slightly lower than baseline at 9.6 likely due to pregnancy.  CMP reassuring with normal renal function and no severe electrolyte derangements.  11:19 AM reassessed patient at bedside who notes nausea has improved after nausea medication.  Will give patient some water for po challenge.   UA significant for ketonuria and proteinuria likely due to dehydration from emesis and diarrhea.  Quantitative hCG elevated at 5590. No concern for ectopic at this time.  12:59 PM assessed patient at bedside.  Patient able to tolerate ginger ale with no difficulties.  Abdomen soft, nondistended, nontender.  Patient states she feels better and is ready to go home.  Will discharge patient with nausea medication.  Advised patient to call OB/GYN today for further evaluation and prenatal care. Fluid rehydration discussed with patient.  Advised patient to call OB/GYN in regards to prescription nausea medication.  Advised to start prenatal and to hold any NSAIDs for pain. Strict ED precautions discussed with patient. Patient states understanding and agrees to plan. Patient discharged home in no acute distress and stable vitals.  Final Clinical Impression(s) / ED Diagnoses Final diagnoses:  Nausea vomiting and diarrhea  Early stage of pregnancy    Rx / DC Orders ED Discharge Orders         Ordered    metoCLOPramide (REGLAN) 10 MG tablet  Every 6 hours     02/01/20 1301           Mannie Stabile, PA-C 02/01/20 1306    Melene Plan, DO 02/01/20 1320

## 2020-02-01 NOTE — Discharge Instructions (Signed)
As discussed, your pregnancy test was positive today in the ER.  Your symptoms are most likely related to gastroenteritis which is a viral infection.  I am sending you home with nausea medication.  Call your OB/GYN today to establish prenatal care.  Also discuss prescribed nausea medication with them.  Start taking a prenatal vitamin.  You may take over-the-counter Tylenol as needed for pain.  Return to the ER for new or worsening symptoms.

## 2020-03-31 ENCOUNTER — Other Ambulatory Visit (HOSPITAL_COMMUNITY)
Admission: RE | Admit: 2020-03-31 | Discharge: 2020-03-31 | Disposition: A | Discharge status: Home / Self Care | Payer: Medicaid Other | Source: Ambulatory Visit | Attending: Obstetrics & Gynecology | Admitting: Obstetrics & Gynecology

## 2020-03-31 ENCOUNTER — Other Ambulatory Visit: Payer: Self-pay

## 2020-03-31 ENCOUNTER — Ambulatory Visit (INDEPENDENT_AMBULATORY_CARE_PROVIDER_SITE_OTHER): Payer: Medicaid Other | Admitting: Obstetrics & Gynecology

## 2020-03-31 ENCOUNTER — Encounter: Payer: Self-pay | Admitting: Obstetrics & Gynecology

## 2020-03-31 ENCOUNTER — Ambulatory Visit (INDEPENDENT_AMBULATORY_CARE_PROVIDER_SITE_OTHER): Payer: Medicaid Other

## 2020-03-31 VITALS — BP 124/76 | HR 80 | Wt 225.3 lb

## 2020-03-31 DIAGNOSIS — O0990 Supervision of high risk pregnancy, unspecified, unspecified trimester: Secondary | ICD-10-CM

## 2020-03-31 DIAGNOSIS — O0933 Supervision of pregnancy with insufficient antenatal care, third trimester: Secondary | ICD-10-CM

## 2020-03-31 DIAGNOSIS — Z3A32 32 weeks gestation of pregnancy: Secondary | ICD-10-CM

## 2020-03-31 DIAGNOSIS — Z23 Encounter for immunization: Secondary | ICD-10-CM | POA: Diagnosis not present

## 2020-03-31 DIAGNOSIS — Z3200 Encounter for pregnancy test, result unknown: Secondary | ICD-10-CM

## 2020-03-31 DIAGNOSIS — Z01818 Encounter for other preprocedural examination: Secondary | ICD-10-CM | POA: Insufficient documentation

## 2020-03-31 DIAGNOSIS — O0993 Supervision of high risk pregnancy, unspecified, third trimester: Secondary | ICD-10-CM

## 2020-03-31 DIAGNOSIS — Z3483 Encounter for supervision of other normal pregnancy, third trimester: Secondary | ICD-10-CM

## 2020-03-31 LAB — POCT URINALYSIS DIP (DEVICE)
Bilirubin Urine: NEGATIVE
Glucose, UA: NEGATIVE mg/dL
Hgb urine dipstick: NEGATIVE
Ketones, ur: NEGATIVE mg/dL
Nitrite: NEGATIVE
Protein, ur: NEGATIVE mg/dL
Specific Gravity, Urine: 1.025 (ref 1.005–1.030)
Urobilinogen, UA: 0.2 mg/dL (ref 0.0–1.0)
pH: 6 (ref 5.0–8.0)

## 2020-03-31 NOTE — Progress Notes (Signed)
Pt here today for pregnancy confirmation appointment. Per chart review patient was seen in the ED on 02/01/20 for N/V and had positive beta HCG level at that time. Pt reports positive pregnancy test in December. Pt reports LMP mid October, unsure of exact date. Reports history of hypertension during pregnancy; no other pregnancy related concerns. Denies any vaginal bleeding, pain, or symptoms of hypertension since December.  Due to uncertain dating and pt being late in pregnancy, informal [place] to be done by Diane Day, RN. Warm hand off completed with Diane, RN.   Front office scheduled patient for provider visit with Penne Lash, MD this AM following [place]. New OB check-in completed. Report given to Penne Lash, MD.  Fleet Contras RN 03/31/20

## 2020-03-31 NOTE — Progress Notes (Signed)
Pt informed that the ultrasound is considered a limited OB ultrasound and is not intended to be a complete ultrasound exam.  Patient also informed that the ultrasound is not being completed with the intent of assessing for fetal or placental anomalies or any pelvic abnormalities.  Explained that the purpose of today's ultrasound is to assess for viability and estimated EGA. Patient acknowledges the purpose of the exam and the limitations of the study.     Pt reports unsure LMP 08/20/19 which is consistent with [redacted] wk EGA today (EDD 05/26/20).  Informal bedside US performed. Single IUP, anterior fundal placenta, amniotic fluid subjectively wnl, vertex presentation. FHR - 124 bpm per PW doppler.  FL= 6.34cm, GA - [redacted]w[redacted]d, EDD 05/21/20. All information reported to Maxwell Marion, RN.

## 2020-04-01 ENCOUNTER — Encounter: Payer: Self-pay | Admitting: *Deleted

## 2020-04-01 LAB — CERVICOVAGINAL ANCILLARY ONLY
Bacterial Vaginitis (gardnerella): POSITIVE — AB
Candida Glabrata: NEGATIVE
Candida Vaginitis: POSITIVE — AB
Chlamydia: POSITIVE — AB
Comment: NEGATIVE
Comment: NEGATIVE
Comment: NEGATIVE
Comment: NEGATIVE
Comment: NEGATIVE
Comment: NORMAL
Neisseria Gonorrhea: NEGATIVE
Trichomonas: NEGATIVE

## 2020-04-01 LAB — CBC/D/PLT+RPR+RH+ABO+RUB AB...
Antibody Screen: NEGATIVE
Basophils Absolute: 0.1 10*3/uL (ref 0.0–0.2)
Basos: 1 %
EOS (ABSOLUTE): 0.1 10*3/uL (ref 0.0–0.4)
Eos: 1 %
HCV Ab: <0.1 s/co ratio (ref 0.0–0.9)
HIV Screen 4th Generation wRfx: NONREACTIVE
Hematocrit: 27.4 % — ABNORMAL LOW (ref 34.0–46.6)
Hemoglobin: 8.3 g/dL — ABNORMAL LOW (ref 11.1–15.9)
Hepatitis B Surface Ag: NEGATIVE
Immature Grans (Abs): 0.1 10*3/uL (ref 0.0–0.1)
Immature Granulocytes: 1 %
Lymphocytes Absolute: 2.2 10*3/uL (ref 0.7–3.1)
Lymphs: 20 %
MCH: 23.4 pg — ABNORMAL LOW (ref 26.6–33.0)
MCHC: 30.3 g/dL — ABNORMAL LOW (ref 31.5–35.7)
MCV: 77 fL — ABNORMAL LOW (ref 79–97)
Monocytes Absolute: 0.8 10*3/uL (ref 0.1–0.9)
Monocytes: 8 %
Neutrophils Absolute: 7.5 10*3/uL — ABNORMAL HIGH (ref 1.4–7.0)
Neutrophils: 69 %
Platelets: 319 10*3/uL (ref 150–450)
RBC: 3.55 x10E6/uL — ABNORMAL LOW (ref 3.77–5.28)
RDW: 16.1 % — ABNORMAL HIGH (ref 11.7–15.4)
RPR Ser Ql: NONREACTIVE
Rh Factor: POSITIVE
Rubella Antibodies, IGG: 3.84 index (ref >0.99)
WBC: 10.8 10*3/uL (ref 3.4–10.8)

## 2020-04-01 LAB — HEMOGLOBIN A1C
Est. average glucose Bld gHb Est-mCnc: 103 mg/dL
Hgb A1c MFr Bld: 5.2 % (ref 4.8–5.6)

## 2020-04-01 LAB — CYTOLOGY - PAP
Chlamydia: POSITIVE — AB
Comment: NEGATIVE
Comment: NORMAL
Diagnosis: NEGATIVE
Neisseria Gonorrhea: NEGATIVE

## 2020-04-01 LAB — HCV INTERPRETATION

## 2020-04-02 LAB — CULTURE, OB URINE

## 2020-04-02 LAB — URINE CULTURE, OB REFLEX

## 2020-04-04 ENCOUNTER — Ambulatory Visit (HOSPITAL_COMMUNITY)
Admission: EM | Admit: 2020-04-04 | Discharge: 2020-04-04 | Disposition: A | Discharge status: Home / Self Care | Payer: Medicaid Other | Attending: Family Medicine | Admitting: Family Medicine

## 2020-04-04 ENCOUNTER — Other Ambulatory Visit: Payer: Self-pay

## 2020-04-04 ENCOUNTER — Encounter (HOSPITAL_COMMUNITY): Payer: Self-pay

## 2020-04-04 DIAGNOSIS — N760 Acute vaginitis: Secondary | ICD-10-CM | POA: Diagnosis not present

## 2020-04-04 DIAGNOSIS — B3731 Acute candidiasis of vulva and vagina: Secondary | ICD-10-CM

## 2020-04-04 DIAGNOSIS — Z3A32 32 weeks gestation of pregnancy: Secondary | ICD-10-CM | POA: Diagnosis not present

## 2020-04-04 DIAGNOSIS — A749 Chlamydial infection, unspecified: Secondary | ICD-10-CM

## 2020-04-04 DIAGNOSIS — B373 Candidiasis of vulva and vagina: Secondary | ICD-10-CM | POA: Diagnosis not present

## 2020-04-04 DIAGNOSIS — O98813 Other maternal infectious and parasitic diseases complicating pregnancy, third trimester: Secondary | ICD-10-CM | POA: Diagnosis not present

## 2020-04-04 DIAGNOSIS — B9689 Other specified bacterial agents as the cause of diseases classified elsewhere: Secondary | ICD-10-CM

## 2020-04-04 MED ORDER — AZITHROMYCIN 250 MG PO TABS
ORAL_TABLET | ORAL | Status: AC
  Filled 2020-04-04: qty 4

## 2020-04-04 MED ORDER — AZITHROMYCIN 250 MG PO TABS
1000.0000 mg | ORAL_TABLET | Freq: Once | ORAL | Status: AC
  Administered 2020-04-04: 1000 mg via ORAL

## 2020-04-04 MED ORDER — CLOTRIMAZOLE 1 % VA CREA: 1.0000 | TOPICAL_CREAM | Freq: Every day | VAGINAL | 0 refills | Status: DC

## 2020-04-04 MED ORDER — METRONIDAZOLE 250 MG PO TABS: 250.0000 mg | ORAL_TABLET | Freq: Three times a day (TID) | ORAL | 0 refills | Status: DC

## 2020-04-04 NOTE — Discharge Instructions (Signed)
Make sure that you are taking a multivitamin with iron You have chlamydia and have been treated with azithromycin You have BV and have been prescribed metronidazole to take 3 times a day for 7 days You also have a yeast infection so have been given a yeast infection cream No sexual relations for 7 days after treatment Follow-up with your OB/GYN as scheduled

## 2020-04-04 NOTE — ED Triage Notes (Signed)
PT states she was tested at [facility] for STIs and came up + for chlamydia and needs tx. Pt states she's [redacted] wks pregnant.

## 2020-04-04 NOTE — ED Provider Notes (Signed)
[facility]    CSN: [id number] Arrival date & time: 04/04/20  1735      History   Chief Complaint Chief Complaint  Patient presents with  . STI tx    HPI Teresa Todd is a 24 y.o. female.   HPI   Patient went for initial prenatal visit this week.  She was found to be [redacted] weeks pregnant.  Prenatal testing was done.  She was found to be positive for chlamydia, BV, and Candida.  Her other laboratory work is reviewed.  She is significantly anemic with a hemoglobin of 8.3/hematocrit 27.4%.  MCV 77 indicating microcytosis.  Patient is reminded to take prenatal vitamin with iron Patient is asymptomatic with her STD positive tests  Past Medical History:  Diagnosis Date  . Medical history non-contributory     Patient Active Problem List   Diagnosis Date Noted  . Supervision of high risk pregnancy, antepartum 03/31/2020  . Tubal ligation evaluation 03/31/2020  . Obesity affecting pregnancy in second trimester   . Supervision of other normal pregnancy, antepartum 12/03/2017    Past Surgical History:  Procedure Laterality Date  . DILATION AND CURETTAGE OF UTERUS      OB History    Gravida  5   Para  2   Term  2   Preterm  0   AB  2   Living  2     SAB  1   TAB  1   Ectopic  0   Multiple  0   Live Births  2            Home Medications    Prior to Admission medications   Medication Sig Start Date End Date Taking? Authorizing Provider  acetaminophen (TYLENOL) 500 MG tablet Take 500 mg by mouth every 6 (six) hours as needed.    [provider]  clotrimazole (GYNE-LOTRIMIN) 1 % vaginal cream Place 1 Applicatorful vaginally at bedtime. 04/04/20   Raylene Everts, MD  metroNIDAZOLE (FLAGYL) 250 MG tablet Take 1 tablet (250 mg total) by mouth 3 (three) times daily. 04/04/20   Raylene Everts, MD    Family History Family History  Problem Relation Age of Onset  . Hypertension Mother   . Hypertension Sister     Social  History Social History   Tobacco Use  . Smoking status: Former Research scientist (life sciences)  . Smokeless tobacco: Never Used  Substance Use Topics  . Alcohol use: No  . Drug use: No     Allergies   Patient has no known allergies.   Review of Systems Review of Systems  Constitutional:       Patient states she feels well     Physical Exam Triage Vital Signs ED Triage Vitals  Enc Vitals Group     BP 04/04/20 1820 137/76     Pulse Rate 04/04/20 1820 76     Resp 04/04/20 1820 16     Temp 04/04/20 1820 98.6 F (37 C)     Temp Source 04/04/20 1820 Oral     SpO2 04/04/20 1820 100 %     Weight 04/04/20 1823 225 lb (102.1 kg)     Height 04/04/20 1823 5\' 3"  (1.6 m)     Head Circumference --      Peak Flow --      Pain Score 04/04/20 1823 0     Pain Loc --      Pain Edu? --      Excl. in [place]? --  No data found.  Updated Vital Signs BP 137/76   Pulse 76   Temp 98.6 F (37 C) (Oral)   Resp 16   Ht 5\' 3"  (1.6 m)   Wt 102.1 kg   LMP 08/20/2019   SpO2 100%   BMI 39.86 kg/m   Visual Acuity Right Eye Distance:   Left Eye Distance:   Bilateral Distance:    Right Eye Near:   Left Eye Near:    Bilateral Near:     Physical Exam Constitutional:      General: She is not in acute distress.    Appearance: She is well-developed. She is obese.  HENT:     Head: Normocephalic and atraumatic.     Mouth/Throat:     Comments: Mask is in place Eyes:     Conjunctiva/sclera: Conjunctivae normal.     Pupils: Pupils are equal, round, and reactive to light.  Cardiovascular:     Rate and Rhythm: Normal rate.  Pulmonary:     Effort: Pulmonary effort is normal. No respiratory distress.  Musculoskeletal:        General: Normal range of motion.     Cervical back: Normal range of motion.  Skin:    General: Skin is warm and dry.  Neurological:     Mental Status: She is alert.  Psychiatric:        Mood and Affect: Mood normal.        Behavior: Behavior normal.      UC Treatments / Results    Labs (all labs ordered are listed, but only abnormal results are displayed) Labs Reviewed - No data to display  EKG   Radiology No results found.  Procedures Procedures (including critical care time)  Medications Ordered in UC Medications  azithromycin (ZITHROMAX) tablet 1,000 mg (1,000 mg Oral Given 04/04/20 1853)    Initial Impression / Assessment and Plan / UC Course  I have reviewed the triage vital signs and the nursing notes.  Pertinent labs & imaging results that were available during my care of the patient were reviewed by me and considered in my medical decision making (see chart for details).     Reviewed test results with patient Reviewed appropriate treatment  Final Clinical Impressions(s) / UC Diagnoses   Final diagnoses:  Pregnancy with 32 completed weeks gestation  Chlamydia infection affecting pregnancy in third trimester  BV (bacterial vaginosis)  Yeast vaginitis     Discharge Instructions     Make sure that you are taking a multivitamin with iron You have chlamydia and have been treated with azithromycin You have BV and have been prescribed metronidazole to take 3 times a day for 7 days You also have a yeast infection so have been given a yeast infection cream No sexual relations for 7 days after treatment Follow-up with your OB/GYN as scheduled   ED Prescriptions    Medication Sig Dispense Auth. Provider   metroNIDAZOLE (FLAGYL) 250 MG tablet Take 1 tablet (250 mg total) by mouth 3 (three) times daily. 21 tablet 04/06/20, MD   clotrimazole (GYNE-LOTRIMIN) 1 % vaginal cream Place 1 Applicatorful vaginally at bedtime. 45 g Eustace Moore, MD     PDMP not reviewed this encounter.   Eustace Moore, MD 04/04/20 04/06/20

## 2020-04-05 ENCOUNTER — Encounter: Payer: Self-pay | Admitting: *Deleted

## 2020-04-05 DIAGNOSIS — O98819 Other maternal infectious and parasitic diseases complicating pregnancy, unspecified trimester: Secondary | ICD-10-CM | POA: Insufficient documentation

## 2020-04-06 ENCOUNTER — Encounter: Payer: Self-pay | Admitting: Obstetrics & Gynecology

## 2020-04-06 ENCOUNTER — Other Ambulatory Visit: Payer: Self-pay

## 2020-04-06 ENCOUNTER — Other Ambulatory Visit: Payer: Self-pay | Admitting: Obstetrics & Gynecology

## 2020-04-06 DIAGNOSIS — O0990 Supervision of high risk pregnancy, unspecified, unspecified trimester: Secondary | ICD-10-CM

## 2020-04-06 NOTE — Progress Notes (Signed)
  Subjective:    Teresa Todd is a [id number] [redacted]w[redacted]d being seen today for her first obstetrical visit.  Her obstetrical history is significant for obesity. She is late to prenatal care.  Patient reports no complaints.  Vitals:   03/31/20 1026  BP: 124/76  Pulse: 80  Weight: 225 lb 4.8 oz (102.2 kg)    HISTORY: OB History  Gravida Para Term Preterm AB Living  5 2 2  0 2 2  SAB TAB Ectopic Multiple Live Births  1 1 0 0 2    # Outcome Date GA Lbr Len/2nd Weight Sex Delivery Anes PTL Lv  5 Current           4 Term 2019    F Vag-Spont None N LIV  3 Term 11/06/16 [redacted]w[redacted]d  6 lb 15 oz (3.147 kg) F Vag-Spont  N LIV  2 TAB           1 SAB            Past Medical History:  Diagnosis Date  . Medical history non-contributory    Past Surgical History:  Procedure Laterality Date  . DILATION AND CURETTAGE OF UTERUS     Family History  Problem Relation Age of Onset  . Hypertension Mother   . Hypertension Sister      Exam    Uterus:     Pelvic Exam:    Perineum: No Hemorrhoids   Vulva: normal   Vagina:  normal mucosa   pH: n/a   Cervix: no lesions   Adnexa: not evaluated   Bony Pelvis: average  System: Breast:  normal appearance, no masses or tenderness   Skin: normal coloration and turgor, no rashes    Neurologic: oriented, normal mood   Extremities: no deformities   HEENT sclera clear, anicteric, oropharynx clear, no lesions, neck supple with midline trachea and thyroid without masses   Mouth/Teeth mucous membranes moist, pharynx normal without lesions   Neck supple and no masses   Cardiovascular: regular rate and rhythm   Respiratory:  appears well, vitals normal, no respiratory distress, acyanotic, normal RR, chest clear, no wheezing, crepitations, rhonchi, normal symmetric air entry   Abdomen: soft, non-tender; bowel sounds normal; no masses,  no organomegaly   Urinary: urethral meatus normal      Assessment:    Pregnancy: [redacted]w[redacted]d Patient Active Problem  List   Diagnosis Date Noted  . Chlamydia infection during pregnancy 04/05/2020  . Supervision of high risk pregnancy, antepartum 03/31/2020  . Tubal ligation evaluation 03/31/2020  . Obesity affecting pregnancy in second trimester   . Supervision of other normal pregnancy, antepartum 12/03/2017        Plan:     Initial labs drawn. Prenatal vitamins. Problem list reviewed and updated.  Panorama OB anatomy 12/05/2017 BTL consent needed  Pap smear Tdap Schedule 2 hour GTT SW consult  [place] 04/06/2020

## 2020-04-07 ENCOUNTER — Telehealth: Payer: Self-pay | Admitting: Lactation Services

## 2020-04-07 NOTE — Telephone Encounter (Signed)
-----   Message from Lesly Dukes, MD sent at 04/06/2020 12:50 PM EDT ----- Patient is anemic and needs a ferritin ordered.  Will treat chlamydia per protocol as well as yeast and BV. RN--please call and give more details about these test.

## 2020-04-07 NOTE — Telephone Encounter (Addendum)
Called and spoke with patient. She reports she has taken the Azithromycin and her partner has been tested and treated. She is aware to abstain from sexual intercourse for 7 days post both partners being treated.   Patient is taking treatment for BV and yeast also.   Patient informed she is anemic and will need to come in for a Ferritin levels and need a TOC in about 3 weeks-can do with 36 week swabs. Patient voiced understanding.   Message to front desk to call patient to schedule for Ferritin level and TOC.

## 2020-04-13 ENCOUNTER — Telehealth (INDEPENDENT_AMBULATORY_CARE_PROVIDER_SITE_OTHER): Payer: Medicaid Other | Admitting: Lactation Services

## 2020-04-13 NOTE — Telephone Encounter (Signed)
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 Recommendation is that patient set up Genetic Counseling Session with Avelina Laine @ [phone] and that partner testing is recommended.   No answer and phone went straight to voicemail. LM for patient to call the office. Will send My Chart message.

## 2020-04-14 ENCOUNTER — Encounter: Payer: Self-pay | Admitting: *Deleted

## 2020-04-15 ENCOUNTER — Encounter: Payer: Medicaid Other | Admitting: Obstetrics and Gynecology

## 2020-04-18 ENCOUNTER — Other Ambulatory Visit: Payer: Self-pay

## 2020-04-18 ENCOUNTER — Encounter: Payer: Self-pay | Admitting: Obstetrics and Gynecology

## 2020-04-18 ENCOUNTER — Ambulatory Visit (INDEPENDENT_AMBULATORY_CARE_PROVIDER_SITE_OTHER): Payer: Medicaid Other | Admitting: Obstetrics and Gynecology

## 2020-04-18 VITALS — BP 136/97 | HR 82

## 2020-04-18 DIAGNOSIS — O98819 Other maternal infectious and parasitic diseases complicating pregnancy, unspecified trimester: Secondary | ICD-10-CM

## 2020-04-18 DIAGNOSIS — Z3A34 34 weeks gestation of pregnancy: Secondary | ICD-10-CM

## 2020-04-18 DIAGNOSIS — A749 Chlamydial infection, unspecified: Secondary | ICD-10-CM

## 2020-04-18 DIAGNOSIS — Z01818 Encounter for other preprocedural examination: Secondary | ICD-10-CM

## 2020-04-18 DIAGNOSIS — O98813 Other maternal infectious and parasitic diseases complicating pregnancy, third trimester: Secondary | ICD-10-CM

## 2020-04-18 DIAGNOSIS — Z3480 Encounter for supervision of other normal pregnancy, unspecified trimester: Secondary | ICD-10-CM

## 2020-04-18 NOTE — Patient Instructions (Signed)
Third Trimester of Pregnancy The third trimester is from week 28 through week 40 (months 7 through 9). The third trimester is a time when the unborn baby (fetus) is growing rapidly. At the end of the ninth month, the fetus is about 20 inches in length and weighs 6-10 pounds. Body changes during your third trimester Your body will continue to go through many changes during pregnancy. The changes vary from woman to woman. During the third trimester:  Your weight will continue to increase. You can expect to gain 25-35 pounds (11-16 kg) by the end of the pregnancy.  You may begin to get stretch marks on your hips, abdomen, and breasts.  You may urinate more often because the fetus is moving lower into your pelvis and pressing on your bladder.  You may develop or continue to have heartburn. This is caused by increased hormones that slow down muscles in the digestive tract.  You may develop or continue to have constipation because increased hormones slow digestion and cause the muscles that push waste through your intestines to relax.  You may develop hemorrhoids. These are swollen veins (varicose veins) in the rectum that can itch or be painful.  You may develop swollen, bulging veins (varicose veins) in your legs.  You may have increased body aches in the pelvis, back, or thighs. This is due to weight gain and increased hormones that are relaxing your joints.  You may have changes in your hair. These can include thickening of your hair, rapid growth, and changes in texture. Some women also have hair loss during or after pregnancy, or hair that feels dry or thin. Your hair will most likely return to normal after your baby is born.  Your breasts will continue to grow and they will continue to become tender. A yellow fluid (colostrum) may leak from your breasts. This is the first milk you are producing for your baby.  Your belly button may stick out.  You may notice more swelling in your hands,  face, or ankles.  You may have increased tingling or numbness in your hands, arms, and legs. The skin on your belly may also feel numb.  You may feel short of breath because of your expanding uterus.  You may have more problems sleeping. This can be caused by the size of your belly, increased need to urinate, and an increase in your body's metabolism.  You may notice the fetus "dropping," or moving lower in your abdomen (lightening).  You may have increased vaginal discharge.  You may notice your joints feel loose and you may have pain around your pelvic bone. What to expect at prenatal visits You will have prenatal exams every 2 weeks until week 36. Then you will have weekly prenatal exams. During a routine prenatal visit:  You will be weighed to make sure you and the baby are growing normally.  Your blood pressure will be taken.  Your abdomen will be measured to track your baby's growth.  The fetal heartbeat will be listened to.  Any test results from the previous visit will be discussed.  You may have a cervical check near your due date to see if your cervix has softened or thinned (effaced).  You will be tested for Group B streptococcus. This happens between 35 and 37 weeks. Your health care provider may ask you:  What your birth plan is.  How you are feeling.  If you are feeling the baby move.  If you have had any abnormal   symptoms, such as leaking fluid, bleeding, severe headaches, or abdominal cramping.  If you are using any tobacco products, including cigarettes, chewing tobacco, and electronic cigarettes.  If you have any questions. Other tests or screenings that may be performed during your third trimester include:  Blood tests that check for low iron levels (anemia).  Fetal testing to check the health, activity level, and growth of the fetus. Testing is done if you have certain medical conditions or if there are problems during the pregnancy.  Nonstress test  (NST). This test checks the health of your baby to make sure there are no signs of problems, such as the baby not getting enough oxygen. During this test, a belt is placed around your belly. The baby is made to move, and its heart rate is monitored during movement. What is false labor? False labor is a condition in which you feel small, irregular tightenings of the muscles in the womb (contractions) that usually go away with rest, changing position, or drinking water. These are called Braxton Hicks contractions. Contractions may last for hours, days, or even weeks before true labor sets in. If contractions come at regular intervals, become more frequent, increase in intensity, or become painful, you should see your health care provider. What are the signs of labor?  Abdominal cramps.  Regular contractions that start at 10 minutes apart and become stronger and more frequent with time.  Contractions that start on the top of the uterus and spread down to the lower abdomen and back.  Increased pelvic pressure and dull back pain.  A watery or bloody mucus discharge that comes from the vagina.  Leaking of amniotic fluid. This is also known as your "water breaking." It could be a slow trickle or a gush. Let your health care provider know if it has a color or strange odor. If you have any of these signs, call your health care provider right away, even if it is before your due date. Follow these instructions at home: Medicines  Follow your health care provider's instructions regarding medicine use. Specific medicines may be either safe or unsafe to take during pregnancy.  Take a prenatal vitamin that contains at least 600 micrograms (mcg) of folic acid.  If you develop constipation, try taking a stool softener if your health care provider approves. Eating and drinking   Eat a balanced diet that includes fresh fruits and vegetables, whole grains, good sources of protein such as meat, eggs, or tofu,  and low-fat dairy. Your health care provider will help you determine the amount of weight gain that is right for you.  Avoid raw meat and uncooked cheese. These carry germs that can cause birth defects in the baby.  If you have low calcium intake from food, talk to your health care provider about whether you should take a daily calcium supplement.  Eat four or five small meals rather than three large meals a day.  Limit foods that are high in fat and processed sugars, such as fried and sweet foods.  To prevent constipation: ? Drink enough fluid to keep your urine clear or pale yellow. ? Eat foods that are high in fiber, such as fresh fruits and vegetables, whole grains, and beans. Activity  Exercise only as directed by your health care provider. Most women can continue their usual exercise routine during pregnancy. Try to exercise for 30 minutes at least 5 days a week. Stop exercising if you experience uterine contractions.  Avoid heavy lifting.  Do   not exercise in extreme heat or humidity, or at high altitudes.  Wear low-heel, comfortable shoes.  Practice good posture.  You may continue to have sex unless your health care provider tells you otherwise. Relieving pain and discomfort  Take frequent breaks and rest with your legs elevated if you have leg cramps or low back pain.  Take warm sitz baths to soothe any pain or discomfort caused by hemorrhoids. Use hemorrhoid cream if your health care provider approves.  Wear a good support bra to prevent discomfort from breast tenderness.  If you develop varicose veins: ? Wear support pantyhose or compression stockings as told by your healthcare provider. ? Elevate your feet for 15 minutes, 3-4 times a day. Prenatal care  Write down your questions. Take them to your prenatal visits.  Keep all your prenatal visits as told by your health care provider. This is important. Safety  Wear your seat belt at all times when driving.  Make  a list of emergency phone numbers, including numbers for family, friends, the hospital, and police and fire departments. General instructions  Avoid cat litter boxes and soil used by cats. These carry germs that can cause birth defects in the baby. If you have a cat, ask someone to clean the litter box for you.  Do not travel far distances unless it is absolutely necessary and only with the approval of your health care provider.  Do not use hot tubs, steam rooms, or saunas.  Do not drink alcohol.  Do not use any products that contain nicotine or tobacco, such as cigarettes and e-cigarettes. If you need help quitting, ask your health care provider.  Do not use any medicinal herbs or unprescribed drugs. These chemicals affect the formation and growth of the baby.  Do not douche or use tampons or scented sanitary pads.  Do not cross your legs for long periods of time.  To prepare for the arrival of your baby: ? Take prenatal classes to understand, practice, and ask questions about labor and delivery. ? Make a trial run to the hospital. ? Visit the hospital and tour the maternity area. ? Arrange for maternity or paternity leave through employers. ? Arrange for family and friends to take care of pets while you are in the hospital. ? Purchase a rear-facing car seat and make sure you know how to install it in your car. ? Pack your hospital bag. ? Prepare the baby's nursery. Make sure to remove all pillows and stuffed animals from the baby's crib to prevent suffocation.  Visit your dentist if you have not gone during your pregnancy. Use a soft toothbrush to brush your teeth and be gentle when you floss. Contact a health care provider if:  You are unsure if you are in labor or if your water has broken.  You become dizzy.  You have mild pelvic cramps, pelvic pressure, or nagging pain in your abdominal area.  You have lower back pain.  You have persistent nausea, vomiting, or  diarrhea.  You have an unusual or bad smelling vaginal discharge.  You have pain when you urinate. Get help right away if:  Your water breaks before 37 weeks.  You have regular contractions less than 5 minutes apart before 37 weeks.  You have a fever.  You are leaking fluid from your vagina.  You have spotting or bleeding from your vagina.  You have severe abdominal pain or cramping.  You have rapid weight loss or weight gain.  You have   shortness of breath with chest pain.  You notice sudden or extreme swelling of your face, hands, ankles, feet, or legs.  Your baby makes fewer than 10 movements in 2 hours.  You have severe headaches that do not go away when you take medicine.  You have vision changes. Summary  The third trimester is from week 28 through week 40, months 7 through 9. The third trimester is a time when the unborn baby (fetus) is growing rapidly.  During the third trimester, your discomfort may increase as you and your baby continue to gain weight. You may have abdominal, leg, and back pain, sleeping problems, and an increased need to urinate.  During the third trimester your breasts will keep growing and they will continue to become tender. A yellow fluid (colostrum) may leak from your breasts. This is the first milk you are producing for your baby.  False labor is a condition in which you feel small, irregular tightenings of the muscles in the womb (contractions) that eventually go away. These are called Braxton Hicks contractions. Contractions may last for hours, days, or even weeks before true labor sets in.  Signs of labor can include: abdominal cramps; regular contractions that start at 10 minutes apart and become stronger and more frequent with time; watery or bloody mucus discharge that comes from the vagina; increased pelvic pressure and dull back pain; and leaking of amniotic fluid. This information is not intended to replace advice given to you by your  health care provider. Make sure you discuss any questions you have with your health care provider. Document Revised: 02/12/2019 Document Reviewed: 11/27/2016 Elsevier Patient Education  2020 [employer]  

## 2020-04-18 NOTE — Progress Notes (Signed)
Subjective:  Teresa Todd is a 24 y.o. [id number] at [redacted]w[redacted]d being seen today for ongoing prenatal care.  She is currently monitored for the following issues for this low-risk pregnancy and has Supervision of other normal pregnancy, antepartum; Obesity affecting pregnancy in second trimester; Supervision of high risk pregnancy, antepartum; Tubal ligation evaluation; and Chlamydia infection during pregnancy on their problem list.  Patient reports general discomfrots of pregnancy.   .  .   . Denies leaking of fluid.   The following portions of the patient's history were reviewed and updated as appropriate: allergies, current medications, past family history, past medical history, past social history, past surgical history and problem list. Problem list updated.  Objective:  There were no vitals filed for this visit.  Fetal Status:           General:  Alert, oriented and cooperative. Patient is in no acute distress.  Skin: Skin is warm and dry. No rash noted.   Cardiovascular: Normal heart rate noted  Respiratory: Normal respiratory effort, no problems with respiration noted  Abdomen: Soft, gravid, appropriate for gestational age.       Pelvic:  Cervical exam deferred        Extremities: Normal range of motion.     Mental Status: Normal mood and affect. Normal behavior. Normal judgment and thought content.   Urinalysis:      Assessment and Plan:  Pregnancy: [id number] at [redacted]w[redacted]d  1. Supervision of other normal pregnancy, antepartum Stable Will do 1 hr glucola tomorrow Cultures next visit  2. Tubal ligation evaluation BTL papers signed  3. Chlamydia infection during pregnancy TOC at next visit  Preterm labor symptoms and general obstetric precautions including but not limited to vaginal bleeding, contractions, leaking of fluid and fetal movement were reviewed in detail with the patient. Please refer to After Visit Summary for other counseling recommendations.  Return in about 2 weeks  (around 05/02/2020) for OB visit, face to face, any provider.   [name], MD

## 2020-04-18 NOTE — Addendum Note (Signed)
Addended by: Henrietta Dine on: 04/18/2020 10:57 AM   Modules accepted: Orders

## 2020-04-19 ENCOUNTER — Ambulatory Visit: Payer: Medicaid Other

## 2020-04-19 ENCOUNTER — Other Ambulatory Visit: Payer: Self-pay | Admitting: General Practice

## 2020-04-19 ENCOUNTER — Other Ambulatory Visit: Payer: Medicaid Other

## 2020-04-19 ENCOUNTER — Ambulatory Visit: Payer: Medicaid Other | Attending: Obstetrics and Gynecology

## 2020-04-19 DIAGNOSIS — O0990 Supervision of high risk pregnancy, unspecified, unspecified trimester: Secondary | ICD-10-CM

## 2020-04-28 ENCOUNTER — Other Ambulatory Visit: Payer: Medicaid Other

## 2020-04-29 ENCOUNTER — Encounter: Payer: Self-pay | Admitting: Obstetrics & Gynecology

## 2020-04-29 DIAGNOSIS — Z148 Genetic carrier of other disease: Secondary | ICD-10-CM | POA: Insufficient documentation

## 2020-05-02 ENCOUNTER — Other Ambulatory Visit (HOSPITAL_COMMUNITY)
Admission: RE | Admit: 2020-05-02 | Discharge: 2020-05-02 | Disposition: A | Discharge status: Home / Self Care | Payer: Medicaid Other | Source: Ambulatory Visit | Attending: Obstetrics and Gynecology | Admitting: Obstetrics and Gynecology

## 2020-05-02 ENCOUNTER — Other Ambulatory Visit: Payer: Self-pay

## 2020-05-02 ENCOUNTER — Telehealth: Payer: Self-pay | Admitting: Lactation Services

## 2020-05-02 ENCOUNTER — Ambulatory Visit (INDEPENDENT_AMBULATORY_CARE_PROVIDER_SITE_OTHER): Payer: Medicaid Other | Admitting: Obstetrics and Gynecology

## 2020-05-02 VITALS — BP 138/86 | HR 83 | Wt 225.1 lb

## 2020-05-02 DIAGNOSIS — Z3480 Encounter for supervision of other normal pregnancy, unspecified trimester: Secondary | ICD-10-CM | POA: Diagnosis not present

## 2020-05-02 DIAGNOSIS — O0930 Supervision of pregnancy with insufficient antenatal care, unspecified trimester: Secondary | ICD-10-CM | POA: Insufficient documentation

## 2020-05-02 DIAGNOSIS — Z3A36 36 weeks gestation of pregnancy: Secondary | ICD-10-CM

## 2020-05-02 DIAGNOSIS — O0933 Supervision of pregnancy with insufficient antenatal care, third trimester: Secondary | ICD-10-CM

## 2020-05-02 DIAGNOSIS — D649 Anemia, unspecified: Secondary | ICD-10-CM

## 2020-05-02 DIAGNOSIS — O99013 Anemia complicating pregnancy, third trimester: Secondary | ICD-10-CM

## 2020-05-02 DIAGNOSIS — D509 Iron deficiency anemia, unspecified: Secondary | ICD-10-CM | POA: Insufficient documentation

## 2020-05-02 NOTE — Telephone Encounter (Signed)
Attempted to call patient x 2, message received that call cannot be completed at this time. Calling to see if patient received information on results of her Horizon and if she has been able to call Avelina Laine for a Administrator, sports. Patient did read previous message on My Chart.   Will send My Chart message to see if patient received information about Genetic Screening and followed up with Genetic Counseling session.

## 2020-05-02 NOTE — Progress Notes (Signed)
   PRENATAL VISIT NOTE  Subjective:  Teresa Todd is a 24 y.o. [id number] at [redacted]w[redacted]d being seen today for ongoing prenatal care.  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  Patient reports she feel tired.  Contractions: Irritability. Vag. Bleeding: None.  Movement: Present. Denies leaking of fluid.   The following portions of the patient's history were reviewed and updated as appropriate: allergies, current medications, past family history, past medical history, past social history, past surgical history and problem list.   Objective:   Vitals:   05/02/20 1512  BP: 138/86  Pulse: 83  Weight: 225 lb 1.6 oz (102.1 kg)    Fetal Status: Fetal Heart Rate (bpm): 132   Movement: Present    Fundal Ht. C/w dates General:  Alert, oriented and cooperative. Patient is in no acute distress.  Skin: Skin is warm and dry. No rash noted.   Cardiovascular: Normal heart rate noted  Respiratory: Normal respiratory effort, no problems with respiration noted  Abdomen: Soft, gravid, appropriate for gestational age.  Pain/Pressure: Absent     Pelvic: Cervical exam deferred        Extremities: Normal range of motion.  Edema: None  Mental Status: Normal mood and affect. Normal behavior. Normal judgment and thought content.   Assessment and Plan:  Pregnancy: [id number] at [redacted]w[redacted]d   1. Supervision of other normal pregnancy, antepartum - GC/Chlamydia probe amp ([place])not at [facility] - Culture, beta strep (group b only) - Glucose, 1 hour gestational  2. Anemia affecting pregnancy in third trimester - Vitamin B12 - Folate - Iron and TIBC - Ferritin  3. Limited prenatal care in third  trimester Patient given information for [facility] for genetic counceling.   Term labor symptoms and general obstetric precautions including but not limited to vaginal bleeding, contractions, leaking of fluid and fetal movement were reviewed in detail with the patient. Please refer to After Visit Summary for other counseling recommendations.   No follow-ups on file.  No future appointments.  Kerrie Buffalo, NP

## 2020-05-02 NOTE — Patient Instructions (Signed)
Pregnancy and Anemia ° °Anemia is a condition in which the concentration of red blood cells, or hemoglobin, in the blood is below normal. Hemoglobin is a substance in red blood cells that carries oxygen to the tissues of the body. Anemia results when enough oxygen does not reach these tissues. °Anemia is common during pregnancy because the woman's body needs more blood volume and blood cells to provide nutrition to the fetus. The fetus needs iron and folic acid as it is developing. Your body may not produce enough red blood cells because of this. Also, during pregnancy, the liquid part of the blood (plasma) increases by about 30-50%, and the red blood cells increase by only 20%. This lowers the concentration of the red blood cells and creates a natural anemia-like situation. °What are the causes? °The most common cause of anemia during pregnancy is not having enough iron in the body to make red blood cells (iron deficiency anemia). Other causes may include: °· Folic acid deficiency. °· Vitamin B12 deficiency. °· Certain prescription or over-the-counter medicines. °· Certain medical conditions or infections that destroy red blood cells. °· A low platelet count and bleeding caused by antibodies that go through the placenta to the fetus from the mother’s blood. °What are the signs or symptoms? °Mild anemia may not be noticeable. If it becomes severe, symptoms may include: °· Feeling tired (fatigue). °· Shortness of breath, especially during activity. °· Weakness. °· Fainting. °· Pale looking skin. °· Headaches. °· A fast or irregular heartbeat (palpitations). °· Dizziness. °How is this diagnosed? °This condition may be diagnosed based on: °· Your medical history and a physical exam. °· Blood tests. °How is this treated? °Treatment for anemia during pregnancy depends on the cause of the anemia. Treatment can include: °· Dietary changes. °· Supplements of iron, vitamin B12, or folic acid. °· A blood transfusion. This may  be needed if anemia is severe. °· Hospitalization. This may be needed if there is a lot of blood loss or severe anemia. °Follow these instructions at home: °· Follow recommendations from your dietitian or health care provider about changing your diet. °· Increase your vitamin C intake. This will help the stomach absorb more iron. Some foods that are high in vitamin C include: °? Oranges. °? Peppers. °? Tomatoes. °? Mangoes. °· Eat a diet rich in iron. This would include foods such as: °? Liver. °? Beef. °? Eggs. °? Whole grains. °? Spinach. °? Dried fruit. °· Take iron and vitamins as told by your health care provider. °· Eat green leafy vegetables. These are a good source of folic acid. °· Keep all follow-up visits as told by your health care provider. This is important. °Contact a health care provider if: °· You have frequent or lasting headaches. °· You look pale. °· You bruise easily. °Get help right away if: °· You have extreme weakness, shortness of breath, or chest pain. °· You become dizzy or have trouble concentrating. °· You have heavy vaginal bleeding. °· You develop a rash. °· You have bloody or black, tarry stools. °· You faint. °· You vomit up blood. °· You vomit repeatedly. °· You have abdominal pain. °· You have a fever. °· You are dehydrated. °Summary °· Anemia is a condition in which the concentration of red blood cells or hemoglobin in the blood is below normal. °· Anemia is common during pregnancy because the woman's body needs more blood volume and blood cells to provide nutrition to the fetus. °· The most   common cause of anemia during pregnancy is not having enough iron in the body to make red blood cells (iron deficiency anemia).  Mild anemia may not be noticeable. If it becomes severe, symptoms may include feeling tired and weak. This information is not intended to replace advice given to you by your health care provider. Make sure you discuss any questions you have with your health care  provider. Document Revised: 04/07/2019 Document Reviewed: 11/27/2016 Elsevier Patient Education  2020 [employer] Inc[employer]  Vaginal Delivery  Vaginal delivery means that you give birth by pushing your baby out of your birth canal (vagina). A team of health care providers will help you before, during, and after vaginal delivery. Birth experiences are unique for every woman and every pregnancy, and birth experiences vary depending on where you choose to give birth. What happens when I arrive at the birth center or hospital? Once you are in labor and have been admitted into the hospital or birth center, your health care provider may:  Review your pregnancy history and any concerns that you have.  Insert an IV into one of your veins. This may be used to give you fluids and medicines.  Check your blood pressure, pulse, temperature, and heart rate (vital signs).  Check whether your bag of water (amniotic sac) has broken (ruptured).  Talk with you about your birth plan and discuss pain control options. Monitoring Your health care provider may monitor your contractions (uterine monitoring) and your baby's heart rate (fetal monitoring). You may need to be monitored:  Often, but not continuously (intermittently).  All the time or for long periods at a time (continuously). Continuous monitoring may be needed if: ? You are taking certain medicines, such as medicine to relieve pain or make your contractions stronger. ? You have pregnancy or labor complications. Monitoring may be done by:  Placing a special stethoscope or a handheld monitoring device on your abdomen to check your baby's heartbeat and to check for contractions.  Placing monitors on your abdomen (external monitors) to record your baby's heartbeat and the frequency and length of contractions.  Placing monitors inside your uterus through your vagina (internal monitors) to record your baby's heartbeat and the frequency, length, and strength  of your contractions. Depending on the type of monitor, it may remain in your uterus or on your baby's head until birth.  Telemetry. This is a type of continuous monitoring that can be done with external or internal monitors. Instead of having to stay in bed, you are able to move around during telemetry. Physical exam Your health care provider may perform frequent physical exams. This may include:  Checking how and where your baby is positioned in your uterus.  Checking your cervix to determine: ? Whether it is thinning out (effacing). ? Whether it is opening up (dilating). What happens during labor and delivery?  Normal labor and delivery is divided into the following three stages: Stage 1  This is the longest stage of labor.  This stage can last for hours or days.  Throughout this stage, you will feel contractions. Contractions generally feel mild, infrequent, and irregular at first. They get stronger, more frequent (about every 2-3 minutes), and more regular as you move through this stage.  This stage ends when your cervix is completely dilated to 4 inches (10 cm) and completely effaced. Stage 2  This stage starts once your cervix is completely effaced and dilated and lasts until the delivery of your baby.  This stage may last  from 20 minutes to 2 hours.  This is the stage where you will feel an urge to push your baby out of your vagina.  You may feel stretching and burning pain, especially when the widest part of your baby's head passes through the vaginal opening (crowning).  Once your baby is delivered, the umbilical cord will be clamped and cut. This usually occurs after waiting a period of 1-2 minutes after delivery.  Your baby will be placed on your bare chest (skin-to-skin contact) in an upright position and covered with a warm blanket. Watch your baby for feeding cues, like rooting or sucking, and help the baby to your breast for his or her first feeding. Stage  3  This stage starts immediately after the birth of your baby and ends after you deliver the placenta.  This stage may take anywhere from 5 to 30 minutes.  After your baby has been delivered, you will feel contractions as your body expels the placenta and your uterus contracts to control bleeding. What can I expect after labor and delivery?  After labor is over, you and your baby will be monitored closely until you are ready to go home to ensure that you are both healthy. Your health care team will teach you how to care for yourself and your baby.  You and your baby will stay in the same room (rooming in) during your hospital stay. This will encourage early bonding and successful breastfeeding.  You may continue to receive fluids and medicines through an IV.  Your uterus will be checked and massaged regularly (fundal massage).  You will have some soreness and pain in your abdomen, vagina, and the area of skin between your vaginal opening and your anus (perineum).  If an incision was made near your vagina (episiotomy) or if you had some vaginal tearing during delivery, cold compresses may be placed on your episiotomy or your tear. This helps to reduce pain and swelling.  You may be given a squirt bottle to use instead of wiping when you go to the bathroom. To use the squirt bottle, follow these steps: ? Before you urinate, fill the squirt bottle with warm water. Do not use hot water. ? After you urinate, while you are sitting on the toilet, use the squirt bottle to rinse the area around your urethra and vaginal opening. This rinses away any urine and blood. ? Fill the squirt bottle with clean water every time you use the bathroom.  It is normal to have vaginal bleeding after delivery. Wear a sanitary pad for vaginal bleeding and discharge. Summary  Vaginal delivery means that you will give birth by pushing your baby out of your birth canal (vagina).  Your health care provider may  monitor your contractions (uterine monitoring) and your baby's heart rate (fetal monitoring).  Your health care provider may perform a physical exam.  Normal labor and delivery is divided into three stages.  After labor is over, you and your baby will be monitored closely until you are ready to go home. This information is not intended to replace advice given to you by your health care provider. Make sure you discuss any questions you have with your health care provider. Document Revised: 11/26/2017 Document Reviewed: 11/26/2017 Elsevier Patient Education  2020 [employer].

## 2020-05-03 LAB — IRON AND TIBC
Iron Saturation: 3 % — CL (ref 15–55)
Iron: 21 ug/dL — ABNORMAL LOW (ref 27–159)
Total Iron Binding Capacity: 636 ug/dL (ref 250–450)
UIBC: 615 ug/dL — ABNORMAL HIGH (ref 131–425)

## 2020-05-03 LAB — GC/CHLAMYDIA PROBE AMP (~~LOC~~) NOT AT ARMC
Chlamydia: NEGATIVE
Comment: NEGATIVE
Comment: NORMAL
Neisseria Gonorrhea: NEGATIVE

## 2020-05-03 LAB — FOLATE: Folate: 5.3 ng/mL (ref >3.0)

## 2020-05-03 LAB — GLUCOSE, 1 HOUR GESTATIONAL: Gestational Diabetes Screen: 120 mg/dL (ref 65–139)

## 2020-05-03 LAB — VITAMIN B12: Vitamin B-12: 319 pg/mL (ref 232–1245)

## 2020-05-03 LAB — FERRITIN: Ferritin: 4 ng/mL — ABNORMAL LOW (ref 15–150)

## 2020-05-05 ENCOUNTER — Telehealth: Payer: Self-pay

## 2020-05-05 LAB — CULTURE, BETA STREP (GROUP B ONLY): Strep Gp B Culture: NEGATIVE

## 2020-05-05 NOTE — Telephone Encounter (Addendum)
-----   Message from Judeth Horn, NP sent at 05/04/2020 12:36 PM EDT ----- Can we please get this patient set up for feraheme infusion  Scheduled infusion for July 7th @ 0900.  Attempted to contact the pt and was unable to L/M to message stating "please try your call again later." MyChart message sent.

## 2020-05-11 ENCOUNTER — Other Ambulatory Visit: Payer: Self-pay

## 2020-05-11 ENCOUNTER — Encounter (HOSPITAL_COMMUNITY)
Admission: RE | Admit: 2020-05-11 | Discharge: 2020-05-11 | Disposition: A | Discharge status: Home / Self Care | Payer: Medicaid Other | Source: Ambulatory Visit | Attending: Family Medicine | Admitting: Family Medicine

## 2020-05-11 DIAGNOSIS — D509 Iron deficiency anemia, unspecified: Secondary | ICD-10-CM | POA: Diagnosis present

## 2020-05-11 DIAGNOSIS — Z3A00 Weeks of gestation of pregnancy not specified: Secondary | ICD-10-CM | POA: Insufficient documentation

## 2020-05-11 DIAGNOSIS — O99013 Anemia complicating pregnancy, third trimester: Secondary | ICD-10-CM | POA: Diagnosis present

## 2020-05-11 DIAGNOSIS — O99019 Anemia complicating pregnancy, unspecified trimester: Secondary | ICD-10-CM | POA: Diagnosis not present

## 2020-05-11 MED ORDER — SODIUM CHLORIDE 0.9 % IV SOLN
510.0000 mg | INTRAVENOUS | Status: DC
  Administered 2020-05-11: 510 mg via INTRAVENOUS
  Filled 2020-05-11: qty 17

## 2020-05-11 NOTE — Progress Notes (Signed)
Short Stay notified us that the patient's order for Feraheme has not been placed.  Per Judeth Horn, NP note orders placed.    Addison Naegeli, RN  05/11/20

## 2020-05-11 NOTE — Discharge Instructions (Signed)

## 2020-05-16 ENCOUNTER — Ambulatory Visit (INDEPENDENT_AMBULATORY_CARE_PROVIDER_SITE_OTHER): Payer: Medicaid Other | Admitting: Medical

## 2020-05-16 ENCOUNTER — Other Ambulatory Visit: Payer: Self-pay

## 2020-05-16 VITALS — BP 131/83 | HR 84 | Wt 228.9 lb

## 2020-05-16 DIAGNOSIS — D649 Anemia, unspecified: Secondary | ICD-10-CM

## 2020-05-16 DIAGNOSIS — O99013 Anemia complicating pregnancy, third trimester: Secondary | ICD-10-CM

## 2020-05-16 DIAGNOSIS — O0993 Supervision of high risk pregnancy, unspecified, third trimester: Secondary | ICD-10-CM

## 2020-05-16 DIAGNOSIS — O99212 Obesity complicating pregnancy, second trimester: Secondary | ICD-10-CM

## 2020-05-16 DIAGNOSIS — Z3A38 38 weeks gestation of pregnancy: Secondary | ICD-10-CM

## 2020-05-16 DIAGNOSIS — O0990 Supervision of high risk pregnancy, unspecified, unspecified trimester: Secondary | ICD-10-CM

## 2020-05-16 DIAGNOSIS — Z309 Encounter for contraceptive management, unspecified: Secondary | ICD-10-CM

## 2020-05-16 DIAGNOSIS — Z01818 Encounter for other preprocedural examination: Secondary | ICD-10-CM

## 2020-05-16 DIAGNOSIS — E669 Obesity, unspecified: Secondary | ICD-10-CM

## 2020-05-16 MED ORDER — PRENATAL PLUS 27-1 MG PO TABS: 1.0000 | ORAL_TABLET | Freq: Every day | ORAL | 4 refills | Status: AC

## 2020-05-16 NOTE — Progress Notes (Signed)
   PRENATAL VISIT NOTE  Subjective:  Teresa Todd is a 24 y.o. [id number] at [redacted]w[redacted]d being seen today for ongoing prenatal care.  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  Patient reports no complaints.  Contractions: Irritability. Vag. Bleeding: None.  Movement: Present. Denies leaking of fluid.   The following portions of the patient's history were reviewed and updated as appropriate: allergies, current medications, past family history, past medical history, past social history, past surgical history and problem list.   Objective:   Vitals:   05/16/20 1053  BP: 131/83  Pulse: 84  Weight: 228 lb 14.4 oz (103.8 kg)    Fetal Status: Fetal Heart Rate (bpm): 124   Movement: Present  Presentation: Vertex  General:  Alert, oriented and cooperative. Patient is in no acute distress.  Skin: Skin is warm and dry. No rash noted.   Cardiovascular: Normal heart rate noted  Respiratory: Normal respiratory effort, no problems with respiration noted  Abdomen: Soft, gravid, appropriate for gestational age.  Pain/Pressure: Absent     Pelvic: Cervical exam performed in the presence of a chaperone Dilation: 1.5 Effacement (%): 20 Station: -2  Extremities: Normal range of motion.  Edema: Trace  Mental Status: Normal mood and affect. Normal behavior. Normal judgment and thought content.   Assessment and Plan:  Pregnancy: [id number] at [redacted]w[redacted]d 1. Supervision of high risk pregnancy, antepartum - prenatal vitamin w/FE, FA (PRENATAL 1 + 1) 27-1 MG TABS tablet; Take 1 tablet by mouth daily at 12 noon.  Dispense: 30 tablet; Refill: 4 - Follow-up US 04/30/20 normal - consider another [place] if not  delivered by end of July  - GBS negative   2. Tubal ligation evaluation - Consent signed previously   3. Anemia affecting pregnancy in third trimester - Fereheme last week and second dose this week - Patient feels much better   4. Obesity affecting pregnancy in second trimester  Term labor symptoms and general obstetric precautions including but not limited to vaginal bleeding, contractions, leaking of fluid and fetal movement were reviewed in detail with the patient. Please refer to After Visit Summary for other counseling recommendations.   Return in about 1 week (around 05/23/2020) for LOB, In-Person, any provider.  Future Appointments  Date Time Provider Department Center  05/18/2020 11:00 AM [facility] ROOM 3 [facility] None  05/23/2020 10:35 AM Currie Paris, NP [facility]  05/30/2020 10:35 AM Raelyn Mora, CNM [facility]    Vonzella Nipple, PA-C

## 2020-05-16 NOTE — Patient Instructions (Signed)
Fetal Movement Counts Patient Name: ________________________________________________ Patient Due Date: ____________________ What is a fetal movement count?  A fetal movement count is the number of times that you feel your baby move during a certain amount of time. This may also be called a fetal kick count. A fetal movement count is recommended for every pregnant woman. You may be asked to start counting fetal movements as early as week 28 of your pregnancy. Pay attention to when your baby is most active. You may notice your baby's sleep and wake cycles. You may also notice things that make your baby move more. You should do a fetal movement count:  When your baby is normally most active.  At the same time each day. A good time to count movements is while you are resting, after having something to eat and drink. How do I count fetal movements? 1. Find a quiet, comfortable area. Sit, or lie down on your side. 2. Write down the date, the start time and stop time, and the number of movements that you felt between those two times. Take this information with you to your health care visits. 3. Write down your start time when you feel the first movement. 4. Count kicks, flutters, swishes, rolls, and jabs. You should feel at least 10 movements. 5. You may stop counting after you have felt 10 movements, or if you have been counting for 2 hours. Write down the stop time. 6. If you do not feel 10 movements in 2 hours, contact your health care provider for further instructions. Your health care provider may want to do additional tests to assess your baby's well-being. Contact a health care provider if:  You feel fewer than 10 movements in 2 hours.  Your baby is not moving like he or she usually does. Date: ____________ Start time: ____________ Stop time: ____________ Movements: ____________ Date: ____________ Start time: ____________ Stop time: ____________ Movements: ____________ Date: ____________  Start time: ____________ Stop time: ____________ Movements: ____________ Date: ____________ Start time: ____________ Stop time: ____________ Movements: ____________ Date: ____________ Start time: ____________ Stop time: ____________ Movements: ____________ Date: ____________ Start time: ____________ Stop time: ____________ Movements: ____________ Date: ____________ Start time: ____________ Stop time: ____________ Movements: ____________ Date: ____________ Start time: ____________ Stop time: ____________ Movements: ____________ Date: ____________ Start time: ____________ Stop time: ____________ Movements: ____________ This information is not intended to replace advice given to you by your health care provider. Make sure you discuss any questions you have with your health care provider. Document Revised: 06/11/2019 Document Reviewed: 06/11/2019 Elsevier Patient Education  2020 Else[employer] Inc[employer] Braxton Hicks Contractions Contractions of the uterus can occur throughout pregnancy, but they are not always a sign that you are in labor. You may have practice contractions called Braxton Hicks contractions. These false labor contractions are sometimes confused with true labor. What are Braxton Hicks contractions? Braxton Hicks contractions are tightening movements that occur in the muscles of the uterus before labor. Unlike true labor contractions, these contractions do not result in opening (dilation) and thinning of the cervix. Toward the end of pregnancy (32-34 weeks), Braxton Hicks contractions can happen more often and may become stronger. These contractions are sometimes difficult to tell apart from true labor because they can be very uncomfortable. You should not feel embarrassed if you go to the hospital with false labor. Sometimes, the only way to tell if you are in true labor is for your health care provider to look for changes in the cervix. The health care provider   will do a physical exam and may  monitor your contractions. If you are not in true labor, the exam should show that your cervix is not dilating and your water has not broken. If there are no other health problems associated with your pregnancy, it is completely safe for you to be sent home with false labor. You may continue to have Braxton Hicks contractions until you go into true labor. How to tell the difference between true labor and false labor True labor  Contractions last 30-70 seconds.  Contractions become very regular.  Discomfort is usually felt in the top of the uterus, and it spreads to the lower abdomen and low back.  Contractions do not go away with walking.  Contractions usually become more intense and increase in frequency.  The cervix dilates and gets thinner. False labor  Contractions are usually shorter and not as strong as true labor contractions.  Contractions are usually irregular.  Contractions are often felt in the front of the lower abdomen and in the groin.  Contractions may go away when you walk around or change positions while lying down.  Contractions get weaker and are shorter-lasting as time goes on.  The cervix usually does not dilate or become thin. Follow these instructions at home:   Take over-the-counter and prescription medicines only as told by your health care provider.  Keep up with your usual exercises and follow other instructions from your health care provider.  Eat and drink lightly if you think you are going into labor.  If Braxton Hicks contractions are making you uncomfortable: ? Change your position from lying down or resting to walking, or change from walking to resting. ? Sit and rest in a tub of warm water. ? Drink enough fluid to keep your urine pale yellow. Dehydration may cause these contractions. ? Do slow and deep breathing several times an hour.  Keep all follow-up prenatal visits as told by your health care provider. This is important. Contact a  health care provider if:  You have a fever.  You have continuous pain in your abdomen. Get help right away if:  Your contractions become stronger, more regular, and closer together.  You have fluid leaking or gushing from your vagina.  You pass blood-tinged mucus (bloody show).  You have bleeding from your vagina.  You have low back pain that you never had before.  You feel your baby's head pushing down and causing pelvic pressure.  Your baby is not moving inside you as much as it used to. Summary  Contractions that occur before labor are called Braxton Hicks contractions, false labor, or practice contractions.  Braxton Hicks contractions are usually shorter, weaker, farther apart, and less regular than true labor contractions. True labor contractions usually become progressively stronger and regular, and they become more frequent.  Manage discomfort from Braxton Hicks contractions by changing position, resting in a warm bath, drinking plenty of water, or practicing deep breathing. This information is not intended to replace advice given to you by your health care provider. Make sure you discuss any questions you have with your health care provider. Document Revised: 10/04/2017 Document Reviewed: 03/07/2017 Elsevier Patient Education  2020 Elsevier Inc.  

## 2020-05-18 ENCOUNTER — Inpatient Hospital Stay (HOSPITAL_COMMUNITY): Admission: RE | Admit: 2020-05-18 | Payer: Medicaid Other | Source: Ambulatory Visit

## 2020-05-23 ENCOUNTER — Other Ambulatory Visit: Payer: Self-pay

## 2020-05-23 ENCOUNTER — Encounter: Payer: Self-pay | Admitting: Nurse Practitioner

## 2020-05-23 ENCOUNTER — Encounter: Payer: Self-pay | Admitting: General Practice

## 2020-05-23 ENCOUNTER — Ambulatory Visit (INDEPENDENT_AMBULATORY_CARE_PROVIDER_SITE_OTHER): Payer: Medicaid Other | Admitting: Nurse Practitioner

## 2020-05-23 ENCOUNTER — Telehealth (HOSPITAL_COMMUNITY): Payer: Self-pay | Admitting: *Deleted

## 2020-05-23 VITALS — BP 123/76 | HR 83 | Wt 230.0 lb

## 2020-05-23 DIAGNOSIS — Z3A39 39 weeks gestation of pregnancy: Secondary | ICD-10-CM | POA: Diagnosis not present

## 2020-05-23 DIAGNOSIS — O99013 Anemia complicating pregnancy, third trimester: Secondary | ICD-10-CM

## 2020-05-23 DIAGNOSIS — O0990 Supervision of high risk pregnancy, unspecified, unspecified trimester: Secondary | ICD-10-CM

## 2020-05-23 DIAGNOSIS — D649 Anemia, unspecified: Secondary | ICD-10-CM

## 2020-05-23 DIAGNOSIS — O0993 Supervision of high risk pregnancy, unspecified, third trimester: Secondary | ICD-10-CM

## 2020-05-23 NOTE — Telephone Encounter (Signed)
Preadmission screen  

## 2020-05-23 NOTE — Progress Notes (Signed)
    Subjective:  Teresa Todd is a 24 y.o. [id number] at [redacted]w[redacted]d being seen today for ongoing prenatal care.  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  Patient reports no complaints.  Contractions: Irritability. Vag. Bleeding: None.  Movement: Present. Denies leaking of fluid.   The following portions of the patient's history were reviewed and updated as appropriate: allergies, current medications, past family history, past medical history, past social history, past surgical history and problem list. Problem list updated.  Objective:   Vitals:   05/23/20 1106  BP: 123/76  Pulse: 83  Weight: 230 lb (104.3 kg)    Fetal Status: Fetal Heart Rate (bpm): 128 Fundal Height: 36 cm Movement: Present  Presentation: Vertex  General:  Alert, oriented and cooperative. Patient is in no acute distress.  Skin: Skin is warm and dry. No rash noted.   Cardiovascular: Normal heart rate noted  Respiratory: Normal respiratory effort, no problems with respiration noted  Abdomen: Soft, gravid, appropriate for gestational age. Pain/Pressure: Present     Pelvic:  Cervical exam performed Dilation: 3 Effacement (%): 50 Station: -2  Extremities: Normal range of motion.  Edema: None  Mental Status: Normal mood and affect. Normal behavior. Normal judgment and thought content.   Urinalysis:      Assessment and Plan:  Pregnancy: [id number] at [redacted]w[redacted]d  1. Supervision of high risk pregnancy, antepartum Few contractions - other babies have come at near 41 weeks Will schedule for NST and BPP in the office in one week  2. Anemia affecting pregnancy in third trimester Had second fereheme dose on  05-18-20  Term labor symptoms and general obstetric precautions including but not limited to vaginal bleeding, contractions, leaking of fluid and fetal movement were reviewed in detail with the patient. Please refer to After Visit Summary for other counseling recommendations.  Return in about 1 week (around 05/30/2020) for in person ROB with NST and BPP with Diane.  Nolene Bernheim, RN, MSN, NP-BC Nurse Practitioner, [facility] for [employer], [facility] 05/23/2020 12:08 PM

## 2020-05-24 ENCOUNTER — Encounter (HOSPITAL_COMMUNITY): Payer: Self-pay

## 2020-05-24 ENCOUNTER — Telehealth (HOSPITAL_COMMUNITY): Payer: Self-pay | Admitting: *Deleted

## 2020-05-24 NOTE — Telephone Encounter (Signed)
Preadmission screen  

## 2020-05-25 ENCOUNTER — Encounter (HOSPITAL_COMMUNITY)
Admission: EM | Disposition: A | Discharge status: Home / Self Care | Payer: Self-pay | Source: Home / Self Care | Attending: Obstetrics and Gynecology

## 2020-05-25 ENCOUNTER — Inpatient Hospital Stay (HOSPITAL_COMMUNITY): Payer: Medicaid Other | Admitting: Anesthesiology

## 2020-05-25 ENCOUNTER — Encounter (HOSPITAL_COMMUNITY): Payer: Self-pay | Admitting: Family Medicine

## 2020-05-25 ENCOUNTER — Inpatient Hospital Stay (HOSPITAL_COMMUNITY)
Admission: EM | Admit: 2020-05-25 | Discharge: 2020-05-26 | DRG: 807 | Disposition: A | Discharge status: Home / Self Care | Payer: Medicaid Other | Attending: Obstetrics and Gynecology | Admitting: Obstetrics and Gynecology

## 2020-05-25 ENCOUNTER — Other Ambulatory Visit: Payer: Self-pay

## 2020-05-25 DIAGNOSIS — D509 Iron deficiency anemia, unspecified: Secondary | ICD-10-CM | POA: Diagnosis present

## 2020-05-25 DIAGNOSIS — Z30017 Encounter for initial prescription of implantable subdermal contraceptive: Secondary | ICD-10-CM | POA: Diagnosis not present

## 2020-05-25 DIAGNOSIS — Z3490 Encounter for supervision of normal pregnancy, unspecified, unspecified trimester: Secondary | ICD-10-CM

## 2020-05-25 DIAGNOSIS — Z01818 Encounter for other preprocedural examination: Secondary | ICD-10-CM

## 2020-05-25 DIAGNOSIS — O4202 Full-term premature rupture of membranes, onset of labor within 24 hours of rupture: Secondary | ICD-10-CM

## 2020-05-25 DIAGNOSIS — O480 Post-term pregnancy: Secondary | ICD-10-CM | POA: Diagnosis present

## 2020-05-25 DIAGNOSIS — O9902 Anemia complicating childbirth: Secondary | ICD-10-CM | POA: Diagnosis present

## 2020-05-25 DIAGNOSIS — O98819 Other maternal infectious and parasitic diseases complicating pregnancy, unspecified trimester: Secondary | ICD-10-CM

## 2020-05-25 DIAGNOSIS — Z3A39 39 weeks gestation of pregnancy: Secondary | ICD-10-CM

## 2020-05-25 DIAGNOSIS — Z87891 Personal history of nicotine dependence: Secondary | ICD-10-CM | POA: Diagnosis not present

## 2020-05-25 DIAGNOSIS — O26893 Other specified pregnancy related conditions, third trimester: Secondary | ICD-10-CM | POA: Diagnosis present

## 2020-05-25 DIAGNOSIS — O99214 Obesity complicating childbirth: Secondary | ICD-10-CM | POA: Diagnosis present

## 2020-05-25 DIAGNOSIS — E669 Obesity, unspecified: Secondary | ICD-10-CM | POA: Diagnosis present

## 2020-05-25 DIAGNOSIS — Z20822 Contact with and (suspected) exposure to covid-19: Secondary | ICD-10-CM | POA: Diagnosis present

## 2020-05-25 DIAGNOSIS — O1490 Unspecified pre-eclampsia, unspecified trimester: Secondary | ICD-10-CM

## 2020-05-25 DIAGNOSIS — O1494 Unspecified pre-eclampsia, complicating childbirth: Secondary | ICD-10-CM | POA: Diagnosis present

## 2020-05-25 DIAGNOSIS — Z3A41 41 weeks gestation of pregnancy: Secondary | ICD-10-CM | POA: Insufficient documentation

## 2020-05-25 DIAGNOSIS — O0930 Supervision of pregnancy with insufficient antenatal care, unspecified trimester: Secondary | ICD-10-CM

## 2020-05-25 DIAGNOSIS — O0990 Supervision of high risk pregnancy, unspecified, unspecified trimester: Secondary | ICD-10-CM

## 2020-05-25 DIAGNOSIS — O99212 Obesity complicating pregnancy, second trimester: Secondary | ICD-10-CM | POA: Diagnosis present

## 2020-05-25 DIAGNOSIS — A749 Chlamydial infection, unspecified: Secondary | ICD-10-CM | POA: Diagnosis present

## 2020-05-25 LAB — CBC
HCT: 33 % — ABNORMAL LOW (ref 36.0–46.0)
Hemoglobin: 9.8 g/dL — ABNORMAL LOW (ref 12.0–15.0)
MCH: 23.6 pg — ABNORMAL LOW (ref 26.0–34.0)
MCHC: 29.7 g/dL — ABNORMAL LOW (ref 30.0–36.0)
MCV: 79.3 fL — ABNORMAL LOW (ref 80.0–100.0)
Platelets: 325 10*3/uL (ref 150–400)
RBC: 4.16 MIL/uL (ref 3.87–5.11)
RDW: 26.2 % — ABNORMAL HIGH (ref 11.5–15.5)
WBC: 9.3 10*3/uL (ref 4.0–10.5)
nRBC: 0 % (ref 0.0–0.2)

## 2020-05-25 LAB — COMPREHENSIVE METABOLIC PANEL
ALT: 12 U/L (ref 0–44)
AST: 24 U/L (ref 15–41)
Albumin: 3.1 g/dL — ABNORMAL LOW (ref 3.5–5.0)
Alkaline Phosphatase: 130 U/L — ABNORMAL HIGH (ref 38–126)
Anion gap: 10 (ref 5–15)
BUN: 6 mg/dL (ref 6–20)
CO2: 17 mmol/L — ABNORMAL LOW (ref 22–32)
Calcium: 9.1 mg/dL (ref 8.9–10.3)
Chloride: 108 mmol/L (ref 98–111)
Creatinine, Ser: 0.72 mg/dL (ref 0.44–1.00)
GFR calc Af Amer: >60 mL/min (ref >60)
GFR calc non Af Amer: >60 mL/min (ref >60)
Glucose, Bld: 99 mg/dL (ref 70–99)
Potassium: 4.1 mmol/L (ref 3.5–5.1)
Sodium: 135 mmol/L (ref 135–145)
Total Bilirubin: 0.7 mg/dL (ref 0.3–1.2)
Total Protein: 7.6 g/dL (ref 6.5–8.1)

## 2020-05-25 LAB — TYPE AND SCREEN
ABO/RH(D): O POS
Antibody Screen: NEGATIVE

## 2020-05-25 LAB — PROTEIN / CREATININE RATIO, URINE
Creatinine, Urine: 94.31 mg/dL
Protein Creatinine Ratio: 0.59 mg/mg{Cre} — ABNORMAL HIGH (ref 0.00–0.15)
Total Protein, Urine: 56 mg/dL

## 2020-05-25 LAB — SARS CORONAVIRUS 2 BY RT PCR (HOSPITAL ORDER, PERFORMED IN ~~LOC~~ HOSPITAL LAB): SARS Coronavirus 2: NEGATIVE

## 2020-05-25 LAB — RPR: RPR Ser Ql: NONREACTIVE

## 2020-05-25 SURGERY — LIGATION, FALLOPIAN TUBE, POSTPARTUM
Anesthesia: Choice | Laterality: Bilateral

## 2020-05-25 MED ORDER — TERBUTALINE SULFATE 1 MG/ML IJ SOLN: 0.2500 mg | Freq: Once | INTRAMUSCULAR | Status: DC | PRN

## 2020-05-25 MED ORDER — DIBUCAINE (PERIANAL) 1 % EX OINT: 1.0000 "application " | TOPICAL_OINTMENT | CUTANEOUS | Status: DC | PRN

## 2020-05-25 MED ORDER — ONDANSETRON HCL 4 MG/2ML IJ SOLN
INTRAMUSCULAR | Status: AC
  Filled 2020-05-25: qty 2

## 2020-05-25 MED ORDER — COCONUT OIL OIL: 1.0000 "application " | TOPICAL_OIL | Status: DC | PRN

## 2020-05-25 MED ORDER — OXYCODONE-ACETAMINOPHEN 5-325 MG PO TABS: 1.0000 | ORAL_TABLET | ORAL | Status: DC | PRN

## 2020-05-25 MED ORDER — FLEET ENEMA 7-19 GM/118ML RE ENEM: 1.0000 | ENEMA | RECTAL | Status: DC | PRN

## 2020-05-25 MED ORDER — ONDANSETRON HCL 4 MG/2ML IJ SOLN: 4.0000 mg | Freq: Four times a day (QID) | INTRAMUSCULAR | Status: DC | PRN

## 2020-05-25 MED ORDER — OXYTOCIN-SODIUM CHLORIDE 30-0.9 UT/500ML-% IV SOLN
2.5000 [IU]/h | INTRAVENOUS | Status: DC
  Administered 2020-05-25: 2.5 [IU]/h via INTRAVENOUS

## 2020-05-25 MED ORDER — FAMOTIDINE 20 MG PO TABS
40.0000 mg | ORAL_TABLET | Freq: Once | ORAL | Status: AC
  Administered 2020-05-25: 40 mg via ORAL
  Filled 2020-05-25: qty 2

## 2020-05-25 MED ORDER — OXYCODONE-ACETAMINOPHEN 5-325 MG PO TABS: 2.0000 | ORAL_TABLET | ORAL | Status: DC | PRN

## 2020-05-25 MED ORDER — IBUPROFEN 600 MG PO TABS
600.0000 mg | ORAL_TABLET | Freq: Three times a day (TID) | ORAL | Status: DC | PRN
  Administered 2020-05-25: 600 mg via ORAL
  Filled 2020-05-25: qty 1

## 2020-05-25 MED ORDER — MIDAZOLAM HCL 2 MG/2ML IJ SOLN
INTRAMUSCULAR | Status: AC
  Filled 2020-05-25: qty 2

## 2020-05-25 MED ORDER — SOD CITRATE-CITRIC ACID 500-334 MG/5ML PO SOLN: 30.0000 mL | ORAL | Status: DC | PRN

## 2020-05-25 MED ORDER — BENZOCAINE-MENTHOL 20-0.5 % EX AERO: 1.0000 "application " | INHALATION_SPRAY | CUTANEOUS | Status: DC | PRN

## 2020-05-25 MED ORDER — PRENATAL MULTIVITAMIN CH
1.0000 | ORAL_TABLET | Freq: Every day | ORAL | Status: DC
  Administered 2020-05-26: 1 via ORAL
  Filled 2020-05-25: qty 1

## 2020-05-25 MED ORDER — ONDANSETRON HCL 4 MG PO TABS: 4.0000 mg | ORAL_TABLET | ORAL | Status: DC | PRN

## 2020-05-25 MED ORDER — SODIUM CHLORIDE 0.9 % IV SOLN
510.0000 mg | INTRAVENOUS | Status: DC
  Administered 2020-05-25: 510 mg via INTRAVENOUS
  Filled 2020-05-25: qty 17

## 2020-05-25 MED ORDER — METOCLOPRAMIDE HCL 10 MG PO TABS
10.0000 mg | ORAL_TABLET | Freq: Once | ORAL | Status: AC
  Administered 2020-05-25: 10 mg via ORAL
  Filled 2020-05-25: qty 1

## 2020-05-25 MED ORDER — MEASLES, MUMPS & RUBELLA VAC IJ SOLR: 0.5000 mL | Freq: Once | INTRAMUSCULAR | Status: DC

## 2020-05-25 MED ORDER — LIDOCAINE HCL (PF) 1 % IJ SOLN: 30.0000 mL | INTRAMUSCULAR | Status: DC | PRN

## 2020-05-25 MED ORDER — SIMETHICONE 80 MG PO CHEW: 80.0000 mg | CHEWABLE_TABLET | ORAL | Status: DC | PRN

## 2020-05-25 MED ORDER — ONDANSETRON HCL 4 MG/2ML IJ SOLN: 4.0000 mg | INTRAMUSCULAR | Status: DC | PRN

## 2020-05-25 MED ORDER — ACETAMINOPHEN 325 MG PO TABS: 650.0000 mg | ORAL_TABLET | Freq: Four times a day (QID) | ORAL | Status: DC | PRN

## 2020-05-25 MED ORDER — DIPHENHYDRAMINE HCL 25 MG PO CAPS: 25.0000 mg | ORAL_CAPSULE | Freq: Four times a day (QID) | ORAL | Status: DC | PRN

## 2020-05-25 MED ORDER — OXYTOCIN-SODIUM CHLORIDE 30-0.9 UT/500ML-% IV SOLN
1.0000 m[IU]/min | INTRAVENOUS | Status: DC
  Administered 2020-05-25: 2 m[IU]/min via INTRAVENOUS
  Filled 2020-05-25: qty 500

## 2020-05-25 MED ORDER — LACTATED RINGERS IV SOLN: INTRAVENOUS | Status: DC

## 2020-05-25 MED ORDER — DEXAMETHASONE SODIUM PHOSPHATE 4 MG/ML IJ SOLN
INTRAMUSCULAR | Status: AC
  Filled 2020-05-25: qty 1

## 2020-05-25 MED ORDER — ACETAMINOPHEN 325 MG PO TABS: 650.0000 mg | ORAL_TABLET | ORAL | Status: DC | PRN

## 2020-05-25 MED ORDER — OXYTOCIN BOLUS FROM INFUSION
333.0000 mL | Freq: Once | INTRAVENOUS | Status: AC
  Administered 2020-05-25: 333 mL via INTRAVENOUS

## 2020-05-25 MED ORDER — SENNOSIDES-DOCUSATE SODIUM 8.6-50 MG PO TABS
2.0000 | ORAL_TABLET | ORAL | Status: DC
  Administered 2020-05-25: 2 via ORAL
  Filled 2020-05-25: qty 2

## 2020-05-25 MED ORDER — WITCH HAZEL-GLYCERIN EX PADS: 1.0000 "application " | MEDICATED_PAD | CUTANEOUS | Status: DC | PRN

## 2020-05-25 MED ORDER — LACTATED RINGERS IV SOLN: 500.0000 mL | INTRAVENOUS | Status: DC | PRN

## 2020-05-25 MED ORDER — FENTANYL CITRATE (PF) 100 MCG/2ML IJ SOLN
INTRAMUSCULAR | Status: AC
  Filled 2020-05-25: qty 2

## 2020-05-25 MED ORDER — TETANUS-DIPHTH-ACELL PERTUSSIS 5-2.5-18.5 LF-MCG/0.5 IM SUSP: 0.5000 mL | Freq: Once | INTRAMUSCULAR | Status: DC

## 2020-05-25 NOTE — Discharge Summary (Addendum)
Postpartum Discharge Summary      Patient Name: Teresa Todd DOB: 1996/08/29 MRN: [id number]  Date of admission: 05/25/2020 Delivery date:05/25/2020  Delivering provider: Chauncey Mann  Date of discharge: 05/26/2020  Admitting diagnosis: Term pregnancy [Z34.90] Labor without complication [E15] Post term pregnancy at [redacted] weeks gestation [O48.0, Z3A.41] Intrauterine pregnancy: [redacted]w[redacted]d    Secondary diagnosis:  Active Problems:   Obesity affecting pregnancy in second trimester   Tubal ligation evaluation   Chlamydia infection during pregnancy   Limited prenatal care   Anemia affecting pregnancy in third trimester   Preeclampsia  Additional problems: None    Discharge diagnosis: Term Pregnancy Delivered                                              Post partum procedures: Nexplanon Augmentation: Pitocin Complications: None  Hospital course: Onset of Labor With Vaginal Delivery      24y.o. yo [id number] admitted in Latent Labor on 05/25/2020. Patient had an uncomplicated labor course as follows: Initial SVE upon admission at 0200 was 3.5/80/-3 with contractions every 5 minutes. She was augmented with Pitocin and quickly progressed to 10/100/+2 at 0346. Membrane Rupture Time/Date: 3:50 AM ,05/25/2020   Delivery Method:Vaginal, Spontaneous  Episiotomy: None  Lacerations:  None  Patient had an uncomplicated postpartum course. Nexplanon done PP. BP's monitored and were elevated with protein-creatine ratio of 0.59 and patient was prescribed Norvasc 10m20mNexplanon was placed on PPD#1. She is ambulating, tolerating a regular diet, passing flatus, and urinating well. Patient is discharged home in stable condition on 05/26/20.  Newborn Data: Birth date:05/25/2020  Birth time:3:50 AM  Gender:Female  Living status:Living  Apgars:9 ,9  Weight:3079 g 3079g  Magnesium Sulfate received: No BMZ received: No Rhophylac:N/A MMR:N/A T-DaP:Given prenatally Flu:  No Transfusion:No  Physical exam  Vitals:   05/25/20 2020 05/26/20 0613 05/26/20 0932 05/26/20 1312  BP: 136/87 (!) 143/86 128/76 (!) 141/89  Pulse: 68 88  81  Resp: 15   16  Temp: 98.6 F (37 C) 97.8 F (36.6 C)  98.8 F (37.1 C)  TempSrc: Oral   Oral  SpO2: 100% 100%  99%  Weight:      Height:       General: alert, cooperative and no distress Lochia: appropriate Uterine Fundus: firm Incision: N/A DVT Evaluation: No evidence of DVT seen on physical exam. Negative Homan's sign. No cords or calf tenderness. No significant calf/ankle edema. Labs: Lab Results  Component Value Date   WBC 9.3 05/25/2020   HGB 9.8 (L) 05/25/2020   HCT 33.0 (L) 05/25/2020   MCV 79.3 (L) 05/25/2020   PLT 325 05/25/2020   CMP Latest Ref Rng & Units 05/25/2020  Glucose 70 - 99 mg/dL 99  BUN 6 - 20 mg/dL 6  Creatinine 0.44 - 1.00 mg/dL 0.72  Sodium 135 - 145 mmol/L 135  Potassium 3.5 - 5.1 mmol/L 4.1  Chloride 98 - 111 mmol/L 108  CO2 22 - 32 mmol/L 17(L)  Calcium 8.9 - 10.3 mg/dL 9.1  Total Protein 6.5 - 8.1 g/dL 7.6  Total Bilirubin 0.3 - 1.2 mg/dL 0.7  Alkaline Phos 38 - 126 U/L 130(H)  AST 15 - 41 U/L 24  ALT 0 - 44 U/L 12   Edinburgh Score: Edinburgh Postnatal Depression Scale Screening Tool 05/26/2020  I have been able  to laugh and see the funny side of things. 0  I have looked forward with enjoyment to things. 0  I have blamed myself unnecessarily when things went wrong. 1  I have been anxious or worried for no good reason. 0  I have felt scared or panicky for no good reason. 0  Things have been getting on top of me. 1  I have been so unhappy that I have had difficulty sleeping. 0  I have felt sad or miserable. 0  I have been so unhappy that I have been crying. 0  The thought of harming myself has occurred to me. 0  Edinburgh Postnatal Depression Scale Total 2     After visit meds:  Allergies as of 05/26/2020   No Known Allergies      Medication List     TAKE these  medications    acetaminophen 500 MG tablet Commonly known as: TYLENOL Take 500 mg by mouth every 6 (six) hours as needed. What changed: Another medication with the same name was added. Make sure you understand how and when to take each.   acetaminophen 325 MG tablet Commonly known as: Tylenol Take 2 tablets (650 mg total) by mouth every 6 (six) hours as needed (for pain scale < 4). What changed: You were already taking a medication with the same name, and this prescription was added. Make sure you understand how and when to take each.   amLODipine 5 MG tablet Commonly known as: NORVASC Take 1 tablet (5 mg total) by mouth daily. Start taking on: May 27, 2020   ibuprofen 600 MG tablet Commonly known as: ADVIL Take 1 tablet (600 mg total) by mouth every 8 (eight) hours as needed for mild pain.   prenatal vitamin w/FE, FA 27-1 MG Tabs tablet Take 1 tablet by mouth daily at 12 noon.         Discharge home in stable condition Infant Feeding: Bottle and Breast Infant Disposition:home with mother Discharge instruction: per After Visit Summary and Postpartum booklet. Activity: Advance as tolerated. Pelvic rest for 6 weeks.  Diet: routine diet Future Appointments: Future Appointments  Date Time Provider [place]  06/02/2020 10:00 AM [facility] NURSE [facility]  06/28/2020 10:15 AM Nugent, Gerrie Nordmann, NP [facility]   Follow up Visit:    Please schedule this patient for a In person postpartum visit in 4 weeks with the following provider: Any provider. Additional Postpartum F/U:BP check 1 week  Low risk pregnancy complicated by: HTN Delivery mode:  Vaginal, Spontaneous  Anticipated Birth Control:  Nexplanon done PP   05/26/2020 Alana Lilland, DO  GME ATTESTATION:  I saw and evaluated the patient. I agree with the findings and the plan of care as documented in the resident's note.  Merilyn Baba, DO OB Fellow, [place] [facility] 05/26/2020 4:10 PM

## 2020-05-25 NOTE — ED Triage Notes (Signed)
Pt having contractions since midnight, ~24mins apart. Due 05/26/2020; 3rd pregnancy

## 2020-05-25 NOTE — ED Provider Notes (Signed)
[facility] EMERGENCY DEPARTMENT Provider Note   CSN: [id number] Arrival date & time: 05/25/20  0141   History Chief Complaint  Patient presents with  . Contractions    Teresa Todd is a 24 y.o. female.  The history is provided by the patient.  Patient with near term pregnancy without complications, EDC 7/22 comes in with uterine contractions starting at 12:30 AM current, currently 8 minutes apart.  At last check at [facility] office, was 3.5 cm dilated.  There has been no bloody show or gush of fluid.  She is G5, P2 with 1 abortion and one miscarriage.  Past Medical History:  Diagnosis Date  . Medical history non-contributory     Patient Active Problem List   Diagnosis Date Noted  . Limited prenatal care 05/02/2020  . Anemia affecting pregnancy in third trimester 05/02/2020  . 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 Chlamydia infection during pregnancy 04/05/2020  . Supervision of high risk pregnancy, antepartum 03/31/2020  . Tubal ligation evaluation 03/31/2020  . Obesity affecting pregnancy in second trimester     Past Surgical History:  Procedure Laterality Date  . DILATION AND CURETTAGE OF UTERUS       OB History    Gravida  5   Para  2   Term  2   Preterm  0   AB  2   Living  2     SAB  1   TAB  1   Ectopic  0   Multiple  0   Live Births  2           Family History  Problem Relation Age of Onset  . Hypertension Mother   . Hypertension Sister     Social History   Tobacco Use  . Smoking status: Former Games developer  . Smokeless tobacco: Never Used  Vaping Use  . Vaping Use: Never used  Substance Use Topics  . Alcohol use: No  . Drug use: No    Home Medications Prior to Admission medications   Medication Sig Start Date End Date Taking? Authorizing Provider  acetaminophen (TYLENOL) 500 MG tablet Take 500 mg by mouth every 6 (six) hours as needed.     [provider]  prenatal vitamin w/FE, FA (PRENATAL 1 + 1) 27-1  MG TABS tablet Take 1 tablet by mouth daily at 12 noon. Patient not taking: Reported on 05/23/2020 05/16/20   Marny Lowenstein, PA-C    Allergies    Patient has no known allergies.  Review of Systems   Review of Systems  All other systems reviewed and are negative.   Physical Exam Updated Vital Signs BP (!) 166/113 (BP Location: Right Arm)   Pulse 93   Temp 97.7 F (36.5 C) (Temporal)   Resp (!) 22   LMP 08/20/2019   SpO2 100%   Physical Exam Vitals and nursing note reviewed.   24 year old female, resting comfortably and in no acute distress. Vital signs are significant for elevated blood pressure. Oxygen saturation is 100%, which is normal. Head is normocephalic and atraumatic. PERRLA, EOMI. Oropharynx is clear. Neck is nontender and supple without adenopathy or JVD. Back is nontender and there is no CVA tenderness. Lungs are clear without rales, wheezes, or rhonchi. Chest is nontender. Heart has regular rate and rhythm without murmur. Abdomen shows gravid uterus with size expected for dates. Pelvic: Cervix is very far posterior, 80% effaced. Extremities have no cyanosis or edema, full range of motion is present. Skin  is warm and dry without rash. Neurologic: Mental status is normal, cranial nerves are intact, there are no motor or sensory deficits.  ED Results / Procedures / Treatments    Procedures Procedures  Medications Ordered in ED Medications - No data to display  ED Course  I have reviewed the triage vital signs and the nursing notes.  MDM Rules/Calculators/A&P Patient with a term pregnancy and contractions suspicious for labor.  OB rapid response team is here.  She is not in at risk of eminent delivery and is taken to maternal admission unit.  Final Clinical Impression(s) / ED Diagnoses Final diagnoses:  Labor without complication  Term pregnancy    Rx / DC Orders ED Discharge Orders    None       Dione Booze, MD 05/25/20 [phone]

## 2020-05-25 NOTE — Progress Notes (Signed)
Called by RN that patient wishes to cancel scheduled ppBTS and would prefer Nexplanon placement prior to discharge.

## 2020-05-25 NOTE — Progress Notes (Signed)
Patient is [redacted]w[redacted]d G5P2 complaining of contractions every 5-8 minutes since 0000. Patient denies leaking of fluid or vaginal bleeding.   Dr. Morene Antu at bedside in ED.   Patient to be admitted to L&D per Dr. Morene Antu.

## 2020-05-25 NOTE — H&P (Addendum)
OBSTETRIC ADMISSION HISTORY AND PHYSICAL  Teresa Todd is a 24 y.o. female [id number] with IUP at [redacted]w[redacted]d by LMP c/w 3rd trimester [place] presenting for SOL. Ctx started around 0000. She reports +FMs, No LOF, no VB, no blurry vision, headaches or peripheral edema, and RUQ pain.  She plans on breast and bottle feeding. She requests BTL for birth control. She received her prenatal care at [facility]   Dating: By LMP and 3rd trimester [place] --->  Estimated Date of Delivery: 05/26/20  Sono:  Unavailable in record  Prenatal History/Complications: Late [facility]; established care at 32w Anemia of pregnancy/Iron deficiency anemia BMI 40 Hx Chlamydia 03/31/20  Past Medical History: Past Medical History:  Diagnosis Date  . Medical history non-contributory     Past Surgical History: Past Surgical History:  Procedure Laterality Date  . DILATION AND CURETTAGE OF UTERUS      Obstetrical History: OB History    Gravida  5   Para  2   Term  2   Preterm  0   AB  2   Living  2     SAB  1   TAB  1   Ectopic  0   Multiple  0   Live Births  2           Social History: Social History   Socioeconomic History  . Marital status: Single    Spouse name: Not on file  . Number of children: Not on file  . Years of education: Not on file  . Highest education level: Not on file  Occupational History  . Not on file  Tobacco Use  . Smoking status: Former Games developer  . Smokeless tobacco: Never Used  Vaping Use  . Vaping Use: Never used  Substance and Sexual Activity  . Alcohol use: No  . Drug use: No  . Sexual activity: Yes    Birth control/protection: None  Other Topics Concern  . Not on file  Social History Narrative  . Not on file   Social Determinants of Health   Financial Resource Strain:   . Difficulty of Paying Living Expenses:   Food Insecurity: No Food Insecurity  . Worried About Programme researcher, broadcasting/film/video in the Last Year: Never true  . Ran Out of Food in the Last Year: Never true   Transportation Needs: Unmet Transportation Needs  . Lack of Transportation (Medical): Yes  . Lack of Transportation (Non-Medical): Yes  Physical Activity:   . Days of Exercise per Week:   . Minutes of Exercise per Session:   Stress:   . Feeling of Stress :   Social Connections:   . Frequency of Communication with Friends and Family:   . Frequency of Social Gatherings with Friends and Family:   . Attends Religious Services:   . Active Member of Clubs or Organizations:   . Attends Banker Meetings:   Marland Kitchen Marital Status:     Family History: Family History  Problem Relation Age of Onset  . Hypertension Mother   . Hypertension Sister     Allergies: No Known Allergies  Medications Prior to Admission  Medication Sig Dispense Refill Last Dose  . acetaminophen (TYLENOL) 500 MG tablet Take 500 mg by mouth every 6 (six) hours as needed.      . prenatal vitamin w/FE, FA (PRENATAL 1 + 1) 27-1 MG TABS tablet Take 1 tablet by mouth daily at 12 noon. (Patient not taking: Reported on 05/23/2020) 30 tablet 4  Review of Systems   All systems reviewed and negative except as stated in HPI  Blood pressure (!) 148/95, pulse 93, temperature 97.7 F (36.5 C), temperature source Temporal, resp. rate (!) 22, last menstrual period 08/20/2019, SpO2 100 %. General appearance: alert, cooperative, appears stated age and no distress Lungs: normal effort Heart: regular rate  Abdomen: soft, non-tender; bowel sounds normal Pelvic: gravid uterus GU: No vaginal lesions  Extremities: Homans sign is negative, no sign of DVT Presentation: cephalic Fetal monitoringBaseline: 135 bpm, Variability: Good {> 6 bpm), Accelerations: Reactive and Decelerations: Absent Uterine activity: Frequency: Every 5 minutes Dilation: 3.5 Effacement (%): 80 Station: -3 Exam by:: Shourya Macpherson   Prenatal labs: ABO, Rh: O/Positive/-- (05/27 1550) Antibody: Negative (05/27 1550) Rubella: 3.84 (05/27 1550) RPR: Non  Reactive (05/27 1550)  HBsAg: Negative (05/27 1550)  HIV: Non Reactive (05/27 1550)  GBS: Negative/-- (06/28 1508)  2 hr Glucola WNL Genetic screening  Low risk Anatomy US normal per chart; record not available  Prenatal Transfer Tool  Maternal Diabetes: No Genetic Screening: Normal Maternal Ultrasounds/Referrals: Normal Fetal Ultrasounds or other Referrals:  None Maternal Substance Abuse:  No Significant Maternal Medications:  None Significant Maternal Lab Results: Group B Strep negative  No results found for this or any previous visit (from the past 24 hour(s)).  Patient Active Problem List   Diagnosis Date Noted  . Post term pregnancy at [redacted] weeks gestation 05/25/2020  . Limited prenatal care 05/02/2020  . Anemia affecting pregnancy in third trimester 05/02/2020  . 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 Chlamydia infection during pregnancy 04/05/2020  . Supervision of high risk pregnancy, antepartum 03/31/2020  . Tubal ligation evaluation 03/31/2020  . Obesity affecting pregnancy in second trimester     Assessment/Plan:  Teresa Todd is a 24 y.o. [id number] at [redacted]w[redacted]d here for SOL and also with elevated BP initially.   #Labor: Vertex by exam. Ctx q62m, will start Pitocin. AROM PRN. Anticipate SVD. #Pain: Per patient request #FWB: Cat I; EFW: 3300g #ID:  GBS neg #MOF: both #MOC: BTL (consent 5/27) #Circ:  Inpatient #Elevated BP: Will cont to monitor and draw labs if persistent beyond 4 hours #Hx Chlamydia: Neg TOC 6/28 #Anemia: Received IV iron x 2. Hgb 9.8 on admission.   Jerilynn Birkenhead, MD [facility] Family Medicine Fellow, [facility] for [facility], [facility] 05/25/2020, 2:13 AM

## 2020-05-25 NOTE — Progress Notes (Signed)
Postpartum tubal consent:  24 y.o. [id number]  with undesired fertility,status post vaginal delivery, expressed desire for permanent sterilization.  I reviewed with the patient her expressed plan for tubal sterilization.  We dicussed other reversible forms of contraception including LARC options with similar effectiveness of BTS. She expressed ambivalence regarding BTS. We discussed options for other birth control and the patient was interested in Nexplanon.    We continued to discuss the BTS procedures. Risks of procedure discussed with patient including but not limited to: risk of regret, permanence of method, bleeding, infection, injury to surrounding organs and need for additional procedures.  Failure risk of 0.5-1% with increased risk of ectopic gestation if pregnancy occurs was also discussed with patient.     Patient was given time to think about her desires reproductive life plan.    Federico Flake, MD, MPH, ABFM

## 2020-05-26 ENCOUNTER — Encounter (HOSPITAL_COMMUNITY): Payer: Medicaid Other

## 2020-05-26 DIAGNOSIS — Z30017 Encounter for initial prescription of implantable subdermal contraceptive: Secondary | ICD-10-CM

## 2020-05-26 DIAGNOSIS — O1490 Unspecified pre-eclampsia, unspecified trimester: Secondary | ICD-10-CM

## 2020-05-26 MED ORDER — LIDOCAINE HCL 1 % IJ SOLN
0.0000 mL | Freq: Once | INTRAMUSCULAR | Status: AC | PRN
  Administered 2020-05-26: 3 mL via INTRADERMAL
  Filled 2020-05-26: qty 20

## 2020-05-26 MED ORDER — IBUPROFEN 600 MG PO TABS: 600.0000 mg | ORAL_TABLET | Freq: Three times a day (TID) | ORAL | 0 refills | Status: AC | PRN

## 2020-05-26 MED ORDER — AMLODIPINE BESYLATE 5 MG PO TABS
5.0000 mg | ORAL_TABLET | Freq: Every day | ORAL | Status: DC
  Administered 2020-05-26: 5 mg via ORAL
  Filled 2020-05-26: qty 1

## 2020-05-26 MED ORDER — AMLODIPINE BESYLATE 5 MG PO TABS: 5.0000 mg | ORAL_TABLET | Freq: Every day | ORAL | 1 refills | Status: AC

## 2020-05-26 MED ORDER — ETONOGESTREL 68 MG ~~LOC~~ IMPL
68.0000 mg | DRUG_IMPLANT | Freq: Once | SUBCUTANEOUS | Status: AC
  Administered 2020-05-26: 68 mg via SUBCUTANEOUS
  Filled 2020-05-26: qty 1

## 2020-05-26 MED ORDER — ACETAMINOPHEN 325 MG PO TABS: 650.0000 mg | ORAL_TABLET | Freq: Four times a day (QID) | ORAL | 0 refills | Status: AC | PRN

## 2020-05-26 NOTE — Discharge Instructions (Signed)
Postpartum Care After Vaginal Delivery This sheet gives you information about how to care for yourself from the time you deliver your baby to up to 6-12 weeks after delivery (postpartum period). Your health care provider may also give you more specific instructions. If you have problems or questions, contact your health care provider. Follow these instructions at home: Vaginal bleeding  It is normal to have vaginal bleeding (lochia) after delivery. Wear a sanitary pad for vaginal bleeding and discharge. ? During the first week after delivery, the amount and appearance of lochia is often similar to a menstrual period. ? Over the next few weeks, it will gradually decrease to a dry, yellow-brown discharge. ? For most women, lochia stops completely by 4-6 weeks after delivery. Vaginal bleeding can vary from woman to woman.  Change your sanitary pads frequently. Watch for any changes in your flow, such as: ? A sudden increase in volume. ? A change in color. ? Large blood clots.  If you pass a blood clot from your vagina, save it and call your health care provider to discuss. Do not flush blood clots down the toilet before talking with your health care provider.  Do not use tampons or douches until your health care provider says this is safe.  If you are not breastfeeding, your period should return 6-8 weeks after delivery. If you are feeding your child breast milk only (exclusive breastfeeding), your period may not return until you stop breastfeeding. Perineal care  Keep the area between the vagina and the anus (perineum) clean and dry as told by your health care provider. Use medicated pads and pain-relieving sprays and creams as directed.  If you had a cut in the perineum (episiotomy) or a tear in the vagina, check the area for signs of infection until you are healed. Check for: ? More redness, swelling, or pain. ? Fluid or blood coming from the cut or tear. ? Warmth. ? Pus or a bad  smell.  You may be given a squirt bottle to use instead of wiping to clean the perineum area after you go to the bathroom. As you start healing, you may use the squirt bottle before wiping yourself. Make sure to wipe gently.  To relieve pain caused by an episiotomy, a tear in the vagina, or swollen veins in the anus (hemorrhoids), try taking a warm sitz bath 2-3 times a day. A sitz bath is a warm water bath that is taken while you are sitting down. The water should only come up to your hips and should cover your buttocks. Breast care  Within the first few days after delivery, your breasts may feel heavy, full, and uncomfortable (breast engorgement). Milk may also leak from your breasts. Your health care provider can suggest ways to help relieve the discomfort. Breast engorgement should go away within a few days.  If you are breastfeeding: ? Wear a bra that supports your breasts and fits you well. ? Keep your nipples clean and dry. Apply creams and ointments as told by your health care provider. ? You may need to use breast pads to absorb milk that leaks from your breasts. ? You may have uterine contractions every time you breastfeed for up to several weeks after delivery. Uterine contractions help your uterus return to its normal size. ? If you have any problems with breastfeeding, work with your health care provider or lactation consultant.  If you are not breastfeeding: ? Avoid touching your breasts a lot. Doing this can make   your breasts produce more milk. ? Wear a good-fitting bra and use cold packs to help with swelling. ? Do not squeeze out (express) milk. This causes you to make more milk. Intimacy and sexuality  Ask your health care provider when you can engage in sexual activity. This may depend on: ? Your risk of infection. ? How fast you are healing. ? Your comfort and desire to engage in sexual activity.  You are able to get pregnant after delivery, even if you have not had  your period. If desired, talk with your health care provider about methods of birth control (contraception). Medicines  Take over-the-counter and prescription medicines only as told by your health care provider.  If you were prescribed an antibiotic medicine, take it as told by your health care provider. Do not stop taking the antibiotic even if you start to feel better. Activity  Gradually return to your normal activities as told by your health care provider. Ask your health care provider what activities are safe for you.  Rest as much as possible. Try to rest or take a nap while your baby is sleeping. Eating and drinking   Drink enough fluid to keep your urine pale yellow.  Eat high-fiber foods every day. These may help prevent or relieve constipation. High-fiber foods include: ? Whole grain cereals and breads. ? Brown rice. ? Beans. ? Fresh fruits and vegetables.  Do not try to lose weight quickly by cutting back on calories.  Take your prenatal vitamins until your postpartum checkup or until your health care provider tells you it is okay to stop. Lifestyle  Do not use any products that contain nicotine or tobacco, such as cigarettes and e-cigarettes. If you need help quitting, ask your health care provider.  Do not drink alcohol, especially if you are breastfeeding. General instructions  Keep all follow-up visits for you and your baby as told by your health care provider. Most women visit their health care provider for a postpartum checkup within the first 3-6 weeks after delivery. Contact a health care provider if:  You feel unable to cope with the changes that your child brings to your life, and these feelings do not go away.  You feel unusually sad or worried.  Your breasts become red, painful, or hard.  You have a fever.  You have trouble holding urine or keeping urine from leaking.  You have little or no interest in activities you used to enjoy.  You have not  breastfed at all and you have not had a menstrual period for 12 weeks after delivery.  You have stopped breastfeeding and you have not had a menstrual period for 12 weeks after you stopped breastfeeding.  You have questions about caring for yourself or your baby.  You pass a blood clot from your vagina. Get help right away if:  You have chest pain.  You have difficulty breathing.  You have sudden, severe leg pain.  You have severe pain or cramping in your lower abdomen.  You bleed from your vagina so much that you fill more than one sanitary pad in one hour. Bleeding should not be heavier than your heaviest period.  You develop a severe headache.  You faint.  You have blurred vision or spots in your vision.  You have bad-smelling vaginal discharge.  You have thoughts about hurting yourself or your baby. If you ever feel like you may hurt yourself or others, or have thoughts about taking your own life, get help  right away. You can go to the nearest emergency department or call:  Your local emergency services (911 in the [place]  A suicide crisis helpline, such as the National Suicide Prevention Lifeline at (667)304-7190. This is open 24 hours a day. Summary  The period of time right after you deliver your newborn up to 6-12 weeks after delivery is called the postpartum period.  Gradually return to your normal activities as told by your health care provider.  Keep all follow-up visits for you and your baby as told by your health care provider. This information is not intended to replace advice given to you by your health care provider. Make sure you discuss any questions you have with your health care provider. Document Revised: 10/25/2017 Document Reviewed: 08/05/2017 Elsevier Patient Education  2020 [employer].  Etonogestrel implant What is this medicine? ETONOGESTREL (et oh noe JES trel) is a contraceptive (birth control) device. It is used to prevent pregnancy. It  can be used for up to 3 years. This medicine may be used for other purposes; ask your health care provider or pharmacist if you have questions. COMMON BRAND NAME(S): Implanon, Nexplanon What should I tell my health care provider before I take this medicine? They need to know if you have any of these conditions:  abnormal vaginal bleeding  blood vessel disease or blood clots  breast, cervical, endometrial, ovarian, liver, or uterine cancer  diabetes  gallbladder disease  heart disease or recent heart attack  high blood pressure  high cholesterol or triglycerides  kidney disease  liver disease  migraine headaches  seizures  stroke  tobacco smoker  an unusual or allergic reaction to etonogestrel, anesthetics or antiseptics, other medicines, foods, dyes, or preservatives  pregnant or trying to get pregnant  breast-feeding How should I use this medicine? This device is inserted just under the skin on the inner side of your upper arm by a health care professional. Talk to your pediatrician regarding the use of this medicine in children. Special care may be needed. Overdosage: If you think you have taken too much of this medicine contact a poison control center or emergency room at once. NOTE: This medicine is only for you. Do not share this medicine with others. What if I miss a dose? This does not apply. What may interact with this medicine? Do not take this medicine with any of the following medications:  amprenavir  fosamprenavir This medicine may also interact with the following medications:  acitretin  aprepitant  armodafinil  bexarotene  bosentan  carbamazepine  certain medicines for fungal infections like fluconazole, ketoconazole, itraconazole and voriconazole  certain medicines to treat hepatitis, HIV or  AIDS  cyclosporine  felbamate  griseofulvin  lamotrigine  modafinil  oxcarbazepine  phenobarbital  phenytoin  primidone  rifabutin  rifampin  rifapentine  St. John's wort  topiramate This list may not describe all possible interactions. Give your health care provider a list of all the medicines, herbs, non-prescription drugs, or dietary supplements you use. Also tell them if you smoke, drink alcohol, or use illegal drugs. Some items may interact with your medicine. What should I watch for while using this medicine? This product does not protect you against HIV infection (AIDS) or other sexually transmitted diseases. You should be able to feel the implant by pressing your fingertips over the skin where it was inserted. Contact your doctor if you cannot feel the implant, and use a non-hormonal birth control method (such as condoms) until your doctor confirms  that the implant is in place. Contact your doctor if you think that the implant may have broken or become bent while in your arm. You will receive a user card from your health care provider after the implant is inserted. The card is a record of the location of the implant in your upper arm and when it should be removed. Keep this card with your health records. What side effects may I notice from receiving this medicine? Side effects that you should report to your doctor or health care professional as soon as possible:  allergic reactions like skin rash, itching or hives, swelling of the face, lips, or tongue  breast lumps, breast tissue changes, or discharge  breathing problems  changes in emotions or moods  coughing up blood  if you feel that the implant may have broken or bent while in your arm  high blood pressure  pain, irritation, swelling, or bruising at the insertion site  scar at site of insertion  signs of infection at the insertion site such as fever, and skin redness, pain or discharge  signs and  symptoms of a blood clot such as breathing problems; changes in vision; chest pain; severe, sudden headache; pain, swelling, warmth in the leg; trouble speaking; sudden numbness or weakness of the face, arm or leg  signs and symptoms of liver injury like dark yellow or brown urine; general ill feeling or flu-like symptoms; light-colored stools; loss of appetite; nausea; right upper belly pain; unusually weak or tired; yellowing of the eyes or skin  unusual vaginal bleeding, discharge Side effects that usually do not require medical attention (report to your doctor or health care professional if they continue or are bothersome):  acne  breast pain or tenderness  headache  irregular menstrual bleeding  nausea This list may not describe all possible side effects. Call your doctor for medical advice about side effects. You may report side effects to FDA at 1-800-FDA-1088. Where should I keep my medicine? This drug is given in a hospital or clinic and will not be stored at home. NOTE: This sheet is a summary. It may not cover all possible information. If you have questions about this medicine, talk to your doctor, pharmacist, or health care provider.  2020 Elsevier/Gold Standard (2019-08-04 11:33:04)

## 2020-05-26 NOTE — Clinical Social Work Maternal (Signed)
°CLINICAL SOCIAL WORK MATERNAL/CHILD NOTE ° °Patient Details  °Name: Teresa Todd °MRN: [id number] °Date of Birth: 10/05/1996 ° °Date:  05/26/2020 ° °Clinical Social Worker Initiating Note:  Travin Marik, LCSW Date/Time: Initiated:  05/26/20/1021    ° °Child's Name:  Teresa Todd  ° °Biological Parents:  Mother, Father (Father: Lamar Booker 05/28/86)  ° °Need for Interpreter:  None  ° °Reason for Referral:  Late or No Prenatal Care    ° °Address:  [address] °[place] [address]  °  °Phone number:  [phone] (home)    ° °Additional phone number:  ° °Household Members/Support Persons (HM/SP):   Household Member/Support Person 1, Household Member/Support Person 2, Household Member/Support Person 3 ° ° °HM/SP Name Relationship DOB or Age  °HM/SP -1 Lamar Booker FOB 05/28/1986  °HM/SP -2 Emilianna Lewis daughter 06/07/18  °HM/SP -3 Carmen Lewis daughter 11/06/16  °HM/SP -4        °HM/SP -5        °HM/SP -6        °HM/SP -7        °HM/SP -8        ° ° °Natural Supports (not living in the home):  Other (Comment)  ° °Professional Supports: None  ° °Employment: Unemployed  ° °Type of Work:    ° °Education:  High school graduate  ° °Homebound arranged:   ° °Financial Resources:  Medicaid  ° °Other Resources:  WIC, Food Stamps    ° °Cultural/Religious Considerations Which May Impact Care:   ° °Strengths:  Ability to meet basic needs  , Pediatrician chosen, Home prepared for child    ° °Psychotropic Medications:        ° °Pediatrician:    [place] area ° °Pediatrician List:  ° °[facility]  °[place]    °[place]    °[place]    °[place]    °[place]    ° ° °Pediatrician Fax Number:   ° °Risk Factors/Current Problems:  None  ° °Cognitive State:  Able to Concentrate  , Alert  , Goal Oriented  , Linear Thinking    ° °Mood/Affect:  Calm  , Happy  , Interested  , Comfortable  , Relaxed    ° °CSW Assessment: CSW met with [name] at bedside to discuss  consult for late prenatal care. CSW introduced self and explained reason for consult. [name] was welcoming, pleasant and remained engaged during assessment. [name] reported that she resides with FOB and 2 daughters. [name] reported that she receives both WIC and food stamps. [name] reported that she has all items needed to care for infant including a crib and car seat. CSW inquired about [name]'s support system, [name] reported that FOB and FOB's sister are supports.  ° °CSW inquired about [name]'s mental health history, [name] denied any mental health history. [name] denied any history of postpartum depression. CSW inquired about how [name] was feeling emotionally, [name] reported that she was feeling great. [name] presented calm and did not demonstrate any acute mental health signs/symptoms. CSW assessed for safety, [name] denied SI, HI and domestic violence.  ° °CSW provided education regarding the baby blues period vs. perinatal mood disorders, discussed treatment and gave resources for mental health follow up if concerns arise.  CSW recommends self-evaluation during the postpartum time period using the New Mom Checklist from Postpartum Progress and encouraged [name] to contact a medical professional if symptoms are noted at any time.   ° °CSW provided review of Sudden Infant Death Syndrome (SIDS) precautions.   ° °CSW informed   [name] about the hospital drug screen policy due to late prenatal care. [name] confirmed that she started prenatal care at 32 weeks because she was being stubborn and unsure about where she wanted to go with the pregnancy. [name] reported that she is so happy now that she decided to have her baby. [name] denied any barriers with getting infant to pediatric appointments.  CSW informed [name] that infant's UDS was negative and CDS would continue to be monitored and a CPS report would be made if warranted. [name] denied any substance use during pregnancy and denied any CPS history. [name] verbalized understanding and denied any questions regarding  hospital drug screen policy.  ° °CSW identifies no further need for intervention and no barriers to discharge at this time. ° ° °CSW Plan/Description:  Sudden Infant Death Syndrome (SIDS) Education, Perinatal Mood and Anxiety Disorder (PMADs) Education, Hospital Drug Screen Policy Information, CSW Will Continue to Monitor Umbilical Cord Tissue Drug Screen Results and Make Report if Warranted, No Further Intervention Required/No Barriers to Discharge  ° ° °Demetra Moya L Charmon Thorson, LCSW °05/26/2020, 12:53 PM °

## 2020-05-26 NOTE — Progress Notes (Addendum)
POSTPARTUM PROGRESS NOTE  Subjective: Teresa Todd is a 24 y.o. [id number] PPD#1 s/p VD at [redacted]w[redacted]d.  She reports she doing well. No acute events overnight. She denies any problems with ambulating, voiding or po intake. Denies nausea or vomiting. She has passed flatus. Pain is well controlled.  Lochia is appropriate.  Objective: Blood pressure (!) 143/86, pulse 88, temperature 97.8 F (36.6 C), resp. rate 15, height 5\' 7"  (1.702 m), weight 104.3 kg, last menstrual period 08/20/2019, SpO2 100 %, unknown if currently breastfeeding.  Physical Exam:  General: alert, cooperative and no distress Chest: no respiratory distress Abdomen: soft, non-tender  Uterine Fundus: firm, appropriately tender Extremities: No calf swelling or tenderness  no edema  Recent Labs    05/25/20 0209  HGB 9.8*  HCT 33.0*    Assessment/Plan: Teresa Todd is a 24 y.o. 22 s/p VD at [redacted]w[redacted]d for SOL and elevated BP.  Routine Postpartum Care: Doing well, pain well-controlled.  -- Continue routine care, lactation support  -- Contraception: Nexplanon -- Feeding: Breast and bottle --Circ: yes  --Pre-Eclampsia: In the last 24 hours BP has been ranging from 134-143/85-87. Since admission she has had several BP measurements over 140/80. Protien-Cr ratio was 0.59. Norvasc 5mg  being started, continue to monitor BP.   I saw and evaluated the patient. I agree with the findings and the plan of care as documented in the resident's note. Okay to discharge today if baby can; RN to page team for orders. Plan for Nexplanon and circ today an. Norvasc initiated; BP check in 1 week.   [redacted]w[redacted]d, MD [facility] Family Medicine Fellow, [facility] for [facility], [facility]    Dispo: Plan for discharge tomorrow.  [employer], DO PGY1 Family Medicine Resident

## 2020-05-26 NOTE — Procedures (Signed)
Post-Placental Nexplanon Insertion Procedure Note  Patient was identified. Informed consent was signed, signed copy in chart. A time-out was performed.    The insertion site was identified 8-10 cm (3-4 inches) from the medial epicondyle of the humerus and 3-5 cm (1.25-2 inches) posterior to (below) the sulcus (groove) between the biceps and triceps muscles of the patient's left arm and marked. The site was prepped and draped in the usual sterile fashion. Pt was prepped with alcohol swab and then injected with 3 cc of 1% lidocaine. The site was prepped with betadine. Nexplanon removed form packaging,  Device confirmed in needle, then inserted full length of needle and withdrawn per handbook instructions. Provider and patient verified presence of the implant in the woman's arm by palpation. Pt insertion site was covered with steristrips/adhesive bandage and pressure bandage. There was minimal blood loss. Patient tolerated procedure well.  Patient was given post procedure instructions and Nexplanon user card with expiration date. Condoms were recommended for STI prevention. Patient was asked to keep the pressure dressing on for 24 hours to minimize bruising and keep the adhesive bandage on for 3-5 days. The patient verbalized understanding of the plan of care and agrees.   Lot # [id number] Expiration Date 08/01/2022  Marlowe Alt, DO OB Fellow, [facility] 05/26/2020 11:21 AM

## 2020-05-30 ENCOUNTER — Encounter: Payer: Medicaid Other | Admitting: Obstetrics and Gynecology

## 2020-05-30 ENCOUNTER — Other Ambulatory Visit: Payer: Medicaid Other

## 2020-05-31 ENCOUNTER — Other Ambulatory Visit (HOSPITAL_COMMUNITY): Payer: Medicaid Other

## 2020-06-02 ENCOUNTER — Inpatient Hospital Stay (HOSPITAL_COMMUNITY): Admission: AD | Admit: 2020-06-02 | Payer: Medicaid Other | Source: Home / Self Care | Admitting: Family Medicine

## 2020-06-02 ENCOUNTER — Inpatient Hospital Stay (HOSPITAL_COMMUNITY): Payer: Medicaid Other

## 2020-06-02 ENCOUNTER — Ambulatory Visit: Payer: Medicaid Other

## 2020-06-10 NOTE — Addendum Note (Signed)
Encounter addended by: Reva Bores, MD on: 06/10/2020 1:04 PM  Actions taken: Episode edited, Problem List modified, Clinical Note Signed

## 2020-06-10 NOTE — Progress Notes (Signed)
Patient with low Ferritin c/w iron deficiency anemia in pregnancy. For IV iron infusion.

## 2020-06-28 ENCOUNTER — Encounter: Payer: Self-pay | Admitting: Women's Health

## 2020-06-28 ENCOUNTER — Ambulatory Visit: Payer: Medicaid Other | Admitting: Women's Health

## 2020-07-14 ENCOUNTER — Encounter: Payer: Self-pay | Admitting: General Practice
